# Patient Record
Sex: Female | Born: 1985 | Race: Black or African American | Hispanic: No | Marital: Married | Smoking: Never smoker
Health system: Southern US, Community
[De-identification: ages and names within clinical notes are randomized; demographics above are authoritative.]

## PROBLEM LIST (undated history)

## (undated) ENCOUNTER — Inpatient Hospital Stay (HOSPITAL_COMMUNITY): Payer: Self-pay

## (undated) DIAGNOSIS — B373 Candidiasis of vulva and vagina: Secondary | ICD-10-CM

## (undated) DIAGNOSIS — Z87440 Personal history of urinary (tract) infections: Secondary | ICD-10-CM

## (undated) DIAGNOSIS — IMO0002 Reserved for concepts with insufficient information to code with codable children: Secondary | ICD-10-CM

## (undated) DIAGNOSIS — B977 Papillomavirus as the cause of diseases classified elsewhere: Secondary | ICD-10-CM

## (undated) DIAGNOSIS — D649 Anemia, unspecified: Secondary | ICD-10-CM

## (undated) DIAGNOSIS — B9689 Other specified bacterial agents as the cause of diseases classified elsewhere: Secondary | ICD-10-CM

## (undated) DIAGNOSIS — R002 Palpitations: Secondary | ICD-10-CM

## (undated) DIAGNOSIS — N760 Acute vaginitis: Secondary | ICD-10-CM

## (undated) DIAGNOSIS — Z87898 Personal history of other specified conditions: Secondary | ICD-10-CM

## (undated) DIAGNOSIS — R51 Headache: Secondary | ICD-10-CM

## (undated) HISTORY — DX: Headache: R51

## (undated) HISTORY — DX: Candidiasis of vulva and vagina: B37.3

## (undated) HISTORY — DX: Reserved for concepts with insufficient information to code with codable children: IMO0002

## (undated) HISTORY — DX: Other specified bacterial agents as the cause of diseases classified elsewhere: B96.89

## (undated) HISTORY — DX: Anemia, unspecified: D64.9

## (undated) HISTORY — DX: Papillomavirus as the cause of diseases classified elsewhere: B97.7

## (undated) HISTORY — DX: Acute vaginitis: N76.0

## (undated) HISTORY — DX: Personal history of other specified conditions: Z87.898

## (undated) HISTORY — DX: Personal history of urinary (tract) infections: Z87.440

---
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
## 2005-03-23 ENCOUNTER — Emergency Department (HOSPITAL_COMMUNITY): Admission: EM | Admit: 2005-03-23 | Discharge: 2005-03-24 | Payer: Self-pay | Admitting: Emergency Medicine

## 2006-09-05 ENCOUNTER — Emergency Department (HOSPITAL_COMMUNITY): Admission: EM | Admit: 2006-09-05 | Discharge: 2006-09-05 | Payer: Self-pay | Admitting: Emergency Medicine

## 2008-01-29 ENCOUNTER — Emergency Department (HOSPITAL_COMMUNITY): Admission: EM | Admit: 2008-01-29 | Discharge: 2008-01-29 | Payer: Self-pay | Admitting: Family Medicine

## 2009-04-19 DIAGNOSIS — IMO0002 Reserved for concepts with insufficient information to code with codable children: Secondary | ICD-10-CM

## 2009-04-19 DIAGNOSIS — R87619 Unspecified abnormal cytological findings in specimens from cervix uteri: Secondary | ICD-10-CM

## 2009-04-19 HISTORY — DX: Unspecified abnormal cytological findings in specimens from cervix uteri: R87.619

## 2009-04-19 HISTORY — DX: Reserved for concepts with insufficient information to code with codable children: IMO0002

## 2009-12-20 DIAGNOSIS — Z87898 Personal history of other specified conditions: Secondary | ICD-10-CM

## 2009-12-20 DIAGNOSIS — B9689 Other specified bacterial agents as the cause of diseases classified elsewhere: Secondary | ICD-10-CM

## 2009-12-20 HISTORY — DX: Personal history of other specified conditions: Z87.898

## 2009-12-20 HISTORY — DX: Other specified bacterial agents as the cause of diseases classified elsewhere: B96.89

## 2011-09-10 LAB — I-STAT 8, (EC8 V) (CONVERTED LAB)
BUN: 4 — ABNORMAL LOW
Bicarbonate: 24.9 — ABNORMAL HIGH
Glucose, Bld: 108 — ABNORMAL HIGH
Operator id: 235561
Sodium: 133 — ABNORMAL LOW
TCO2: 26
pCO2, Ven: 40.1 — ABNORMAL LOW

## 2012-02-21 ENCOUNTER — Ambulatory Visit (INDEPENDENT_AMBULATORY_CARE_PROVIDER_SITE_OTHER): Payer: 59 | Admitting: Registered Nurse

## 2012-02-21 DIAGNOSIS — Z3040 Encounter for surveillance of contraceptives, unspecified: Secondary | ICD-10-CM

## 2012-03-15 ENCOUNTER — Encounter (INDEPENDENT_AMBULATORY_CARE_PROVIDER_SITE_OTHER): Payer: 59 | Admitting: Obstetrics and Gynecology

## 2012-03-15 DIAGNOSIS — B3731 Acute candidiasis of vulva and vagina: Secondary | ICD-10-CM

## 2012-03-15 DIAGNOSIS — B373 Candidiasis of vulva and vagina: Secondary | ICD-10-CM

## 2012-03-15 HISTORY — DX: Acute candidiasis of vulva and vagina: B37.31

## 2012-03-15 HISTORY — DX: Candidiasis of vulva and vagina: B37.3

## 2012-07-27 ENCOUNTER — Ambulatory Visit (INDEPENDENT_AMBULATORY_CARE_PROVIDER_SITE_OTHER): Payer: 59 | Admitting: Obstetrics and Gynecology

## 2012-07-27 DIAGNOSIS — Z331 Pregnant state, incidental: Secondary | ICD-10-CM

## 2012-07-27 DIAGNOSIS — Z3201 Encounter for pregnancy test, result positive: Secondary | ICD-10-CM

## 2012-07-27 LAB — POCT URINALYSIS DIPSTICK
Bilirubin, UA: NEGATIVE
Ketones, UA: NEGATIVE
Leukocytes, UA: NEGATIVE
Nitrite, UA: NEGATIVE
pH, UA: 6

## 2012-07-28 LAB — PRENATAL PANEL VII
Antibody Screen: NEGATIVE
Basophils Absolute: 0 10*3/uL (ref 0.0–0.1)
Basophils Relative: 1 % (ref 0–1)
Eosinophils Absolute: 0.1 10*3/uL (ref 0.0–0.7)
Eosinophils Relative: 1 % (ref 0–5)
HCT: 37.5 % (ref 36.0–46.0)
HIV: NONREACTIVE
Hemoglobin: 12.5 g/dL (ref 12.0–15.0)
MCH: 27.7 pg (ref 26.0–34.0)
MCHC: 33.3 g/dL (ref 30.0–36.0)
Monocytes Absolute: 0.6 10*3/uL (ref 0.1–1.0)
Monocytes Relative: 8 % (ref 3–12)
RDW: 14.3 % (ref 11.5–15.5)
Rh Type: POSITIVE

## 2012-07-29 LAB — CULTURE, OB URINE

## 2012-07-31 ENCOUNTER — Encounter: Payer: 59 | Admitting: Obstetrics and Gynecology

## 2012-07-31 LAB — HEMOGLOBINOPATHY EVALUATION
Hgb A2 Quant: 2.8 % (ref 2.2–3.2)
Hgb A: 97.2 % (ref 96.8–97.8)
Hgb F Quant: 0 % (ref 0.0–2.0)

## 2012-08-24 ENCOUNTER — Ambulatory Visit (INDEPENDENT_AMBULATORY_CARE_PROVIDER_SITE_OTHER): Payer: 59

## 2012-08-24 ENCOUNTER — Ambulatory Visit (INDEPENDENT_AMBULATORY_CARE_PROVIDER_SITE_OTHER): Payer: 59 | Admitting: Obstetrics and Gynecology

## 2012-08-24 ENCOUNTER — Encounter: Payer: Self-pay | Admitting: Obstetrics and Gynecology

## 2012-08-24 VITALS — BP 110/70 | Ht 64.0 in | Wt 146.0 lb

## 2012-08-24 DIAGNOSIS — O368390 Maternal care for abnormalities of the fetal heart rate or rhythm, unspecified trimester, not applicable or unspecified: Secondary | ICD-10-CM

## 2012-08-24 DIAGNOSIS — Z124 Encounter for screening for malignant neoplasm of cervix: Secondary | ICD-10-CM

## 2012-08-24 DIAGNOSIS — Z331 Pregnant state, incidental: Secondary | ICD-10-CM

## 2012-08-24 DIAGNOSIS — Z3490 Encounter for supervision of normal pregnancy, unspecified, unspecified trimester: Secondary | ICD-10-CM

## 2012-08-24 DIAGNOSIS — B373 Candidiasis of vulva and vagina: Secondary | ICD-10-CM | POA: Insufficient documentation

## 2012-08-24 LAB — POCT WET PREP (WET MOUNT): pH: 5

## 2012-08-24 LAB — POCT URINALYSIS DIPSTICK
Glucose, UA: NEGATIVE
Leukocytes, UA: NEGATIVE
Protein, UA: NEGATIVE
Urobilinogen, UA: NEGATIVE

## 2012-08-24 LAB — US OB LIMITED

## 2012-08-24 MED ORDER — FLUCONAZOLE 100 MG PO TABS: 100.0000 mg | ORAL_TABLET | Freq: Every day | ORAL | Status: DC

## 2012-08-24 NOTE — Progress Notes (Signed)
Vag itching since yesterday  C/o lower abdominal tightness Pt needs pap today  Pt declines genetic screenings

## 2012-08-24 NOTE — Progress Notes (Signed)
[redacted]w[redacted]d Subjective:    Tasha Collins is being seen today for her first obstetrical visit at [redacted]w[redacted]d gestation by LMP 06/17/12 (Certain of dating)  She reports slight nausea which is transient. Does not desire medication at this time.  Her obstetrical history is significant for: There is no problem list on file for this patient. The patient reports no medical or surgical history. Relationship with FOB:  Involved and supportive and attended NOB visit.  Patient does intend to breast feed.   Pregnancy history fully reviewed.   Review of Systems Pertinent ROS is described in HPI   Objective:   BP 110/70  Ht 5\' 4"  (1.626 m)  Wt 146 lb (66.225 kg)  BMI 25.06 kg/m2  LMP 06/17/2012 Wt Readings from Last 1 Encounters:  08/24/12 146 lb (66.225 kg)   BMI: Body mass index is 25.06 kg/(m^2).  General: alert, cooperative and no distress Respiratory: clear to auscultation bilaterally Cardiovascular: regular rate and rhythm, S1, S2 normal, no murmur Breasts:  No dominant masses, nipples erect Gastrointestinal: soft, non-tender; no masses,  no organomegaly Extremities: extremities normal, no pain or edema Vaginal Bleeding: None  History of Abnormal Paps in the past . Hs had Colpo in the past but have normal Paps in the past few years. Pap today. Last Pap on record 01/2011.  EXTERNAL GENITALIA: normal appearing vulva with no masses, tenderness or lesions VAGINA: no abnormal discharge or lesions CERVIX: no lesions or cervical motion tenderness; cervix closed, long, firm UTERUS: gravid and consistent with 9 weeks ADNEXA: no masses palpable and nontender OB EXAM PELVIMETRY: appears adequate   FHR:  162 bpm by USS.  Assessment:    Pregnancy at  [redacted]w[redacted]d  Plan:     Prenatal panel reviewed and discussed with the patient:yes Pap smear collected:yes GC/Chlamydia collected:yes Wet prep:  PH 4.0 Yeast infection noted. Discussion of Genetic testing options: discussed and patient declined any  screening. Prenatal vitamins recommended - taking same Problem list reviewed and updated.  Plan of care: Follow up in 4 weeks for ROB  Malala Trenkamp CNM, [place] 08/24/2012 1:16 PM

## 2012-08-26 LAB — CULTURE, OB URINE

## 2012-08-28 LAB — PAP IG, CT-NG, RFX HPV ASCU

## 2012-09-21 ENCOUNTER — Encounter: Payer: Self-pay | Admitting: Obstetrics and Gynecology

## 2012-09-21 ENCOUNTER — Ambulatory Visit (INDEPENDENT_AMBULATORY_CARE_PROVIDER_SITE_OTHER): Payer: 59 | Admitting: Obstetrics and Gynecology

## 2012-09-21 VITALS — BP 108/60 | Wt 158.0 lb

## 2012-09-21 DIAGNOSIS — T360X5A Adverse effect of penicillins, initial encounter: Secondary | ICD-10-CM

## 2012-09-21 DIAGNOSIS — Z880 Allergy status to penicillin: Secondary | ICD-10-CM | POA: Insufficient documentation

## 2012-09-21 DIAGNOSIS — Z331 Pregnant state, incidental: Secondary | ICD-10-CM

## 2012-09-21 DIAGNOSIS — Z888 Allergy status to other drugs, medicaments and biological substances status: Secondary | ICD-10-CM

## 2012-09-21 NOTE — Patient Instructions (Signed)
ABCs of Pregnancy  A  Antepartum care is very important. Be sure you see your doctor and get prenatal care as soon as you think you are pregnant. At this time, you will be tested for infection, genetic abnormalities and potential problems with you and the pregnancy. This is the time to discuss diet, exercise, work, medications, labor, pain medication during labor and the possibility of a cesarean delivery. Ask any questions that may concern you. It is important to see your doctor regularly throughout your pregnancy. Avoid exposure to toxic substances and chemicals - such as cleaning solvents, lead and mercury, some insecticides, and paint. Pregnant women should avoid exposure to paint fumes, and fumes that cause you to feel ill, dizzy or faint. When possible, it is a good idea to have a pre-pregnancy consultation with your caregiver to begin some important recommendations your caregiver suggests such as, taking folic acid, exercising, quitting smoking, avoiding alcoholic beverages, etc.  B  Breastfeeding is the healthiest choice for both you and your baby. It has many nutritional benefits for the baby and health benefits for the mother. It also creates a very tight and loving bond between the baby and mother. Talk to your doctor, your family and friends, and your employer about how you choose to feed your baby and how they can support you in your decision. Not all birth defects can be prevented, but a woman can take actions that may increase her chance of having a healthy baby. Many birth defects happen very early in pregnancy, sometimes before a woman even knows she is pregnant. Birth defects or abnormalities of any child in your or the father's family should be discussed with your caregiver. Get a good support bra as your breast size changes. Wear it especially when you exercise and when nursing.   C  Celebrate the news of your pregnancy with the your spouse/father and family. Childbirth classes are helpful to  take for you and the spouse/father because it helps to understand what happens during the pregnancy, labor and delivery. Cesarean delivery should be discussed with your doctor so you are prepared for that possibility. The pros and cons of circumcision if it is a boy, should be discussed with your pediatrician. Cigarette smoking during pregnancy can result in low birth weight babies. It has been associated with infertility, miscarriages, tubal pregnancies, infant death (mortality) and poor health (morbidity) in childhood. Additionally, cigarette smoking may cause long-term learning disabilities. If you smoke, you should try to quit before getting pregnant and not smoke during the pregnancy. Secondary smoke may also harm a mother and her developing baby. It is a good idea to ask people to stop smoking around you during your pregnancy and after the baby is born. Extra calcium is necessary when you are pregnant and is found in your prenatal vitamin, in dairy products, green leafy vegetables and in calcium supplements.  D  A healthy diet according to your current weight and height, along with vitamins and mineral supplements should be discussed with your caregiver. Domestic abuse or violence should be made known to your doctor right away to get the situation corrected. Drink more water when you exercise to keep hydrated. Discomfort of your back and legs usually develops and progresses from the middle of the second trimester through to delivery of the baby. This is because of the enlarging baby and uterus, which may also affect your balance. Do not take illegal drugs. Illegal drugs can seriously harm the baby and you. Drink extra   fluids (water is best) throughout pregnancy to help your body keep up with the increases in your blood volume. Drink at least 6 to 8 glasses of water, fruit juice, or milk each day. A good way to know you are drinking enough fluid is when your urine looks almost like clear water or is very light  yellow.   E  Eat healthy to get the nutrients you and your unborn baby need. Your meals should include the five basic food groups. Exercise (30 minutes of light to moderate exercise a day) is important and encouraged during pregnancy, if there are no medical problems or problems with the pregnancy. Exercise that causes discomfort or dizziness should be stopped and reported to your caregiver. Emotions during pregnancy can change from being ecstatic to depression and should be understood by you, your partner and your family.  F  Fetal screening with ultrasound, amniocentesis and monitoring during pregnancy and labor is common and sometimes necessary. Take 400 micrograms of folic acid daily both before, when possible, and during the first few months of pregnancy to reduce the risk of birth defects of the brain and spine. All women who could possibly become pregnant should take a vitamin with folic acid, every day. It is also important to eat a healthy diet with fortified foods (enriched grain products, including cereals, rice, breads, and pastas) and foods with natural sources of folate (orange juice, green leafy vegetables, beans, peanuts, broccoli, asparagus, peas, and lentils). The father should be involved with all aspects of the pregnancy including, the prenatal care, childbirth classes, labor, delivery, and postpartum time. Fathers may also have emotional concerns about being a father, financial needs, and raising a family.  G  Genetic testing should be done appropriately. It is important to know your family and the father's history. If there have been problems with pregnancies or birth defects in your family, report these to your doctor. Also, genetic counselors can talk with you about the information you might need in making decisions about having a family. You can call a major medical center in your area for help in finding a board-certified genetic counselor. Genetic testing and counseling should be done  before pregnancy when possible, especially if there is a history of problems in the mother's or father's family. Certain ethnic backgrounds are more at risk for genetic defects.  H  Get familiar with the hospital where you will be having your baby. Get to know how long it takes to get there, the labor and delivery area, and the hospital procedures. Be sure your medical insurance is accepted there. Get your home ready for the baby including, clothes, the baby's room (when possible), furniture and car seat. Hand washing is important throughout the day, especially after handling raw meat and poultry, changing the baby's diaper or using the bathroom. This can help prevent the spread of many bacteria and viruses that cause infection. Your hair may become dry and thinner, but will return to normal a few weeks after the baby is born. Heartburn is a common problem that can be treated by taking antacids recommended by your caregiver, eating smaller meals 5 or 6 times a day, not drinking liquids when eating, drinking between meals and raising the head of your bed 2 to 3 inches.  I  Insurance to cover you, the baby, doctor and hospital should be reviewed so that you will be prepared to pay any costs not covered by your insurance plan. If you do not have medical insurance,   there are usually clinics and services available for you in your community. Take 30 milligrams of iron during your pregnancy as prescribed by your doctor to reduce the risk of low red blood cells (anemia) later in pregnancy. All women of childbearing age should eat a diet rich in iron.  J  There should be a joint effort for the mother, father and any other children to adapt to the pregnancy financially, emotionally, and psychologically during the pregnancy. Join a support group for moms-to-be. Or, join a class on parenting or childbirth. Have the family participate when possible.  K  Know your limits. Let your caregiver know if you experience any of the  following:   · Pain of any kind.  · Strong cramps.  · You develop a lot of weight in a short period of time (5 pounds in 3 to 5 days).  · Vaginal bleeding, leaking of amniotic fluid.  · Headache, vision problems.  · Dizziness, fainting, shortness of breath.  · Chest pain.  · Fever of 102° F (38.9° C) or higher.  · Gush of clear fluid from your vagina.  · Painful urination.  · Domestic violence.  · Irregular heartbeat (palpitations).  · Rapid beating of the heart (tachycardia).  · Constant feeling sick to your stomach (nauseous) and vomiting.  · Trouble walking, fluid retention (edema).  · Muscle weakness.  · If your baby has decreased activity.  · Persistent diarrhea.  · Abnormal vaginal discharge.  · Uterine contractions at 20-minute intervals.  · Back pain that travels down your leg.  L  Learn and practice that what you eat and drink should be in moderation and healthy for you and your baby. Legal drugs such as alcohol and caffeine are important issues for pregnant women. There is no safe amount of alcohol a woman can drink while pregnant. Fetal alcohol syndrome, a disorder characterized by growth retardation, facial abnormalities, and central nervous system dysfunction, is caused by a woman's use of alcohol during pregnancy. Caffeine, found in tea, coffee, soft drinks and chocolate, should also be limited. Be sure to read labels when trying to cut down on caffeine during pregnancy. More than 200 foods, beverages, and over-the-counter medications contain caffeine and have a high salt content! There are coffees and teas that do not contain caffeine.  M  Medical conditions such as diabetes, epilepsy, and high blood pressure should be treated and kept under control before pregnancy when possible, but especially during pregnancy. Ask your caregiver about any medications that may need to be changed or adjusted during pregnancy. If you are currently taking any medications, ask your caregiver if it is safe to take them  while you are pregnant or before getting pregnant when possible. Also, be sure to discuss any herbs or vitamins you are taking. They are medicines, too! Discuss with your doctor all medications, prescribed and over-the-counter, that you are taking. During your prenatal visit, discuss the medications your doctor may give you during labor and delivery.  N  Never be afraid to ask your doctor or caregiver questions about your health, the progress of the pregnancy, family problems, stressful situations, and recommendation for a pediatrician, if you do not have one. It is better to take all precautions and discuss any questions or concerns you may have during your office visits. It is a good idea to write down your questions before you visit the doctor.  O  Over-the-counter cough and cold remedies may contain alcohol or other ingredients that should   be avoided during pregnancy. Ask your caregiver about prescription, herbs or over-the-counter medications that you are taking or may consider taking while pregnant.   P  Physical activity during pregnancy can benefit both you and your baby by lessening discomfort and fatigue, providing a sense of well-being, and increasing the likelihood of early recovery after delivery. Light to moderate exercise during pregnancy strengthens the belly (abdominal) and back muscles. This helps improve posture. Practicing yoga, walking, swimming, and cycling on a stationary bicycle are usually safe exercises for pregnant women. Avoid scuba diving, exercise at high altitudes (over 3000 feet), skiing, horseback riding, contact sports, etc. Always check with your doctor before beginning any kind of exercise, especially during pregnancy and especially if you did not exercise before getting pregnant.  Q  Queasiness, stomach upset and morning sickness are common during pregnancy. Eating a couple of crackers or dry toast before getting out of bed. Foods that you normally love may make you feel sick to  your stomach. You may need to substitute other nutritious foods. Eating 5 or 6 small meals a day instead of 3 large ones may make you feel better. Do not drink with your meals, drink between meals. Questions that you have should be written down and asked during your prenatal visits.  R  Read about and make plans to baby-proof your home. There are important tips for making your home a safer environment for your baby. Review the tips and make your home safer for you and your baby. Read food labels regarding calories, salt and fat content in the food.  S  Saunas, hot tubs, and steam rooms should be avoided while you are pregnant. Excessive high heat may be harmful during your pregnancy. Your caregiver will screen and examine you for sexually transmitted diseases and genetic disorders during your prenatal visits. Learn the signs of labor. Sexual relations while pregnant is safe unless there is a medical or pregnancy problem and your caregiver advises against it.  T  Traveling long distances should be avoided especially in the third trimester of your pregnancy. If you do have to travel out of state, be sure to take a copy of your medical records and medical insurance plan with you. You should not travel long distances without seeing your doctor first. Most airlines will not allow you to travel after 36 weeks of pregnancy. Toxoplasmosis is an infection caused by a parasite that can seriously harm an unborn baby. Avoid eating undercooked meat and handling cat litter. Be sure to wear gloves when gardening. Tingling of the hands and fingers is not unusual and is due to fluid retention. This will go away after the baby is born.  U  Womb (uterus) size increases during the first trimester. Your kidneys will begin to function more efficiently. This may cause you to feel the need to urinate more often. You may also leak urine when sneezing, coughing or laughing. This is due to the growing uterus pressing against your bladder,  which lies directly in front of and slightly under the uterus during the first few months of pregnancy. If you experience burning along with frequency of urination or bloody urine, be sure to tell your doctor. The size of your uterus in the third trimester may cause a problem with your balance. It is advisable to maintain good posture and avoid wearing high heels during this time. An ultrasound of your baby may be necessary during your pregnancy and is safe for you and your baby.  V    Vaccinations are an important concern for pregnant women. Get needed vaccines before pregnancy. Center for Disease Control (www.cdc.gov) has clear guidelines for the use of vaccines during pregnancy. Review the list, be sure to discuss it with your doctor. Prenatal vitamins are helpful and healthy for you and the baby. Do not take extra vitamins except what is recommended. Taking too much of certain vitamins can cause overdose problems. Continuous vomiting should be reported to your caregiver. Varicose veins may appear especially if there is a family history of varicose veins. They should subside after the delivery of the baby. Support hose helps if there is leg discomfort.  W  Being overweight or underweight during pregnancy may cause problems. Try to get within 15 pounds of your ideal weight before pregnancy. Remember, pregnancy is not a time to be dieting! Do not stop eating or start skipping meals as your weight increases. Both you and your baby need the calories and nutrition you receive from a healthy diet. Be sure to consult with your doctor about your diet. There is a formula and diet plan available depending on whether you are overweight or underweight. Your caregiver or nutritionist can help and advise you if necessary.  X  Avoid X-rays. If you must have dental work or diagnostic tests, tell your dentist or physician that you are pregnant so that extra care can be taken. X-rays should only be taken when the risks of not taking  them outweigh the risk of taking them. If needed, only the minimum amount of radiation should be used. When X-rays are necessary, protective lead shields should be used to cover areas of the body that are not being X-rayed.  Y  Your baby loves you. Breastfeeding your baby creates a loving and very close bond between the two of you. Give your baby a healthy environment to live in while you are pregnant. Infants and children require constant care and guidance. Their health and safety should be carefully watched at all times. After the baby is born, rest or take a nap when the baby is sleeping.  Z  Get your ZZZs. Be sure to get plenty of rest. Resting on your side as often as possible, especially on your left side is advised. It provides the best circulation to your baby and helps reduce swelling. Try taking a nap for 30 to 45 minutes in the afternoon when possible. After the baby is born rest or take a nap when the baby is sleeping. Try elevating your feet for that amount of time when possible. It helps the circulation in your legs and helps reduce swelling.   Most information courtesy of the CDC.  Document Released: 12/06/2005 Document Revised: 02/28/2012 Document Reviewed: 08/20/2009  [employer]® Patient Information ©2013 [employer].

## 2012-09-21 NOTE — Progress Notes (Signed)
Pt without c/o She declined genetic testing

## 2012-09-26 ENCOUNTER — Telehealth: Payer: Self-pay | Admitting: Obstetrics and Gynecology

## 2012-09-26 NOTE — Telephone Encounter (Signed)
Spoke with pt rgd concerns pt c/o some cramping yesterday for about 30 mins and occ today wants to know if normal no spotting no bleeding no leaking fluids drinking 8 glasses of water advised pt some cramping i normal because uterus is growing increase water intake can take tylenol for discomfort call office if spotting bleeding occur pt voice understanding

## 2012-10-09 ENCOUNTER — Telehealth: Payer: Self-pay | Admitting: Obstetrics and Gynecology

## 2012-10-09 NOTE — Telephone Encounter (Signed)
Tc to pt per headaches x 3 days. Pt c/o some visual changes and dizziness. Pt drinking approx 9 8oz glasses of water daily and has tried Tylenol XS without improvement. Pt with h/o migraines in middle school. Informed pt to try Ibuprofen OTC 600mg q 6hours x 24 hours. Told to cb with report of how headaches are going. Pt to cb before 24 hours if sx's worsen or headaches persist. Pt voices understanding.  

## 2012-10-10 ENCOUNTER — Ambulatory Visit (INDEPENDENT_AMBULATORY_CARE_PROVIDER_SITE_OTHER): Payer: 59 | Admitting: Obstetrics and Gynecology

## 2012-10-10 ENCOUNTER — Telehealth: Payer: Self-pay

## 2012-10-10 VITALS — BP 124/64 | Wt 157.0 lb

## 2012-10-10 DIAGNOSIS — Z3490 Encounter for supervision of normal pregnancy, unspecified, unspecified trimester: Secondary | ICD-10-CM

## 2012-10-10 DIAGNOSIS — Z331 Pregnant state, incidental: Secondary | ICD-10-CM

## 2012-10-10 DIAGNOSIS — Z139 Encounter for screening, unspecified: Secondary | ICD-10-CM

## 2012-10-10 DIAGNOSIS — Z3689 Encounter for other specified antenatal screening: Secondary | ICD-10-CM

## 2012-10-10 LAB — CBC
HCT: 38.8 % (ref 36.0–46.0)
Hemoglobin: 13.2 g/dL (ref 12.0–15.0)
MCV: 82.6 fL (ref 78.0–100.0)
Platelets: 294 10*3/uL (ref 150–400)
RBC: 4.7 MIL/uL (ref 3.87–5.11)
WBC: 6.9 10*3/uL (ref 4.0–10.5)

## 2012-10-10 MED ORDER — METOCLOPRAMIDE HCL 10 MG PO TABS: 10.0000 mg | ORAL_TABLET | Freq: Four times a day (QID) | ORAL | Status: DC | PRN

## 2012-10-10 NOTE — Progress Notes (Signed)
Pulse 84 today. Tasha Collins

## 2012-10-10 NOTE — Progress Notes (Signed)
[redacted]w[redacted]d C/o lt headedness and dizziness Anatomy scan RTO 3wks Check TSH and CBC and Vit D Likely having round ligament pain recs discussed Pulse 84 Trial of reglan as well pt gets nausea with occas HA

## 2012-10-10 NOTE — Telephone Encounter (Signed)
Tc from pt. Pt states,"headache is gone;however feeling light headed". Pt also c/o SOB today. Appt sched today @ 3:30 with [facility] for eval. Pt agrees.

## 2012-10-10 NOTE — Addendum Note (Signed)
Addended by: Marla Roe A on: 10/10/2012 05:25 PM   Modules accepted: Orders

## 2012-10-19 ENCOUNTER — Encounter: Payer: 59 | Admitting: Obstetrics and Gynecology

## 2012-10-31 ENCOUNTER — Ambulatory Visit (INDEPENDENT_AMBULATORY_CARE_PROVIDER_SITE_OTHER): Payer: 59

## 2012-10-31 ENCOUNTER — Ambulatory Visit (INDEPENDENT_AMBULATORY_CARE_PROVIDER_SITE_OTHER): Payer: 59 | Admitting: Obstetrics and Gynecology

## 2012-10-31 ENCOUNTER — Encounter: Payer: Self-pay | Admitting: Obstetrics and Gynecology

## 2012-10-31 VITALS — BP 108/68 | Wt 162.0 lb

## 2012-10-31 DIAGNOSIS — Z3689 Encounter for other specified antenatal screening: Secondary | ICD-10-CM

## 2012-10-31 DIAGNOSIS — Z331 Pregnant state, incidental: Secondary | ICD-10-CM

## 2012-10-31 NOTE — Progress Notes (Signed)
Pt w/o complaint today, will decide if she wants the flu shot.

## 2012-10-31 NOTE — Progress Notes (Signed)
[place] for anatomy  S=D.   cx 3.98 cm Normal adnexa B Breech pres, posterior placenta with 3 vc Normal anatomy Bilateral pyelectasis.  Need f/u US in 4 weeks Pt without c/o Declined genetic screening.  Will get flu vaccine on [place]

## 2012-11-02 LAB — US OB COMP + 14 WK

## 2012-11-28 ENCOUNTER — Ambulatory Visit (INDEPENDENT_AMBULATORY_CARE_PROVIDER_SITE_OTHER): Payer: 59 | Admitting: Obstetrics and Gynecology

## 2012-11-28 ENCOUNTER — Ambulatory Visit (INDEPENDENT_AMBULATORY_CARE_PROVIDER_SITE_OTHER): Payer: 59

## 2012-11-28 ENCOUNTER — Encounter: Payer: Self-pay | Admitting: Obstetrics and Gynecology

## 2012-11-28 VITALS — BP 114/64 | Wt 163.0 lb

## 2012-11-28 DIAGNOSIS — O289 Unspecified abnormal findings on antenatal screening of mother: Secondary | ICD-10-CM

## 2012-11-28 DIAGNOSIS — Z23 Encounter for immunization: Secondary | ICD-10-CM

## 2012-11-28 DIAGNOSIS — O358XX0 Maternal care for other (suspected) fetal abnormality and damage, not applicable or unspecified: Secondary | ICD-10-CM

## 2012-11-28 DIAGNOSIS — Z331 Pregnant state, incidental: Secondary | ICD-10-CM

## 2012-11-28 DIAGNOSIS — O35EXX0 Maternal care for other (suspected) fetal abnormality and damage, fetal genitourinary anomalies, not applicable or unspecified: Secondary | ICD-10-CM | POA: Insufficient documentation

## 2012-11-28 DIAGNOSIS — Z880 Allergy status to penicillin: Secondary | ICD-10-CM

## 2012-11-28 NOTE — Patient Instructions (Signed)
Hypoglycemia information

## 2012-11-28 NOTE — Progress Notes (Signed)
[redacted]w[redacted]d Pt c/o having dizziness daily x 1 week. Hgb nl.  Probable hypoglycemia.  Dietary hints given Pt received flu vaccine  ULTRASOUND: Left Ovary:    Length: 4.50 CM  Width: 2.04 CM  Height: 2.72 CM Right Ovary:  Length: 3.85 CM  Width: 2.45 CM  Height: 2.83 CM  Left ovary:Normal Right ovary:NormaL           SIUP  S=D     [place] EDD: 03/25/2013           AFI: N/A           Cervical length: 3.22 cm           Placenta localization: posterior           Fetal presentation: VERTEX Comments: Vertical pocket = 5.3 CM. Bilateral Pyelectasis seen ( right kidney = 0.26 cm, Left Kidney = 0.53 cm ) suggest f/u in 4-6 weeks. Normal ovaries/adnexas Repeat u/s in 6 wks

## 2012-12-06 ENCOUNTER — Telehealth: Payer: Self-pay | Admitting: Obstetrics and Gynecology

## 2012-12-07 NOTE — Telephone Encounter (Signed)
TC TO PT REGARDING MESSAGE. PT STATES SHE IS HAVING SOME PAIN THAT MIGRATES FROM STOMACH TO HER BACK. PT STATES SHE SHE IS DRINKING 10 GLASSES OF WATER A DAY.  PT STATES SHE DOES FEEL THE BABY MOVING AND SHE IS NOT HAVING ANY SPOTTING. ADVISED PT TO CONTINUE TO PUSH FLUIDS AND INFORMED PT THAT SHE CAN TAKE TYLENOL OR IBUPROFEN(WHICH EVER SHE CAN TOLERATE AND SEE IF THA T WILL GIVE PT RELIEF. IF PT START TO HAVE MORE PAIN OR IF PT START TO SPOT OR CRAMP; TO GIVE [place] A CALL BACK. PT VOICED UNDERSTANDING.

## 2012-12-15 LAB — US OB FOLLOW UP

## 2012-12-20 NOTE — L&D Delivery Note (Signed)
Delivery Note  After laboring down about 2 hours, pt pushed approx 15 minutes  At 10:48 PM a viable female was delivered via Vaginal, Spontaneous Delivery (Presentation: ; Occiput Anterior). Delivered through loose nuchal cord APGAR: 8, 9; weight: pending    Placenta status: Intact, Spontaneous.  Cord: 3 vessels with the following complications: None.  Cord pH: n/a  Anesthesia: Epidural, local  Episiotomy: None Lacerations: Perineal; partial 3rd degree Suture Repair: 3.0 vicryl rapide Est. Blood Loss (mL): 300  Mom to postpartum.  Baby to nursery-stable. Pt plans to BF Infant remains skin-skin Mom and baby stable in recovery room Routine PP orders   Andrae Claunch M 03/23/2013, 12:11 AM

## 2012-12-26 ENCOUNTER — Other Ambulatory Visit: Payer: 59

## 2012-12-26 ENCOUNTER — Encounter: Payer: Self-pay | Admitting: Obstetrics and Gynecology

## 2012-12-26 ENCOUNTER — Ambulatory Visit (INDEPENDENT_AMBULATORY_CARE_PROVIDER_SITE_OTHER): Payer: 59 | Admitting: Obstetrics and Gynecology

## 2012-12-26 VITALS — BP 104/70 | Wt 168.0 lb

## 2012-12-26 DIAGNOSIS — N1330 Unspecified hydronephrosis: Secondary | ICD-10-CM

## 2012-12-26 DIAGNOSIS — Z3480 Encounter for supervision of other normal pregnancy, unspecified trimester: Secondary | ICD-10-CM

## 2012-12-26 DIAGNOSIS — N2889 Other specified disorders of kidney and ureter: Secondary | ICD-10-CM

## 2012-12-26 LAB — CBC
HCT: 34.4 % — ABNORMAL LOW (ref 36.0–46.0)
RDW: 14.4 % (ref 11.5–15.5)
WBC: 6.8 10*3/uL (ref 4.0–10.5)

## 2012-12-26 NOTE — Progress Notes (Signed)
[redacted]w[redacted]d H/o pyelectasis - u/s to f/u at [place] H/o rt low back pain 2wks ago that lasted about an hour but resolved itself with no recurrence No urinary sxs FKCs

## 2012-12-27 LAB — GLUCOSE TOLERANCE, 1 HOUR (50G) W/O FASTING: Glucose, 1 Hour GTT: 143 mg/dL — ABNORMAL HIGH (ref 70–140)

## 2012-12-29 ENCOUNTER — Telehealth: Payer: Self-pay

## 2012-12-29 DIAGNOSIS — N1330 Unspecified hydronephrosis: Secondary | ICD-10-CM

## 2012-12-29 DIAGNOSIS — O99810 Abnormal glucose complicating pregnancy: Secondary | ICD-10-CM

## 2012-12-29 NOTE — Telephone Encounter (Signed)
Pt was called and advised need of 3 hr gtt. Pt has appt on 01/02/2013 at [facility] lab [address]@ 8:30 am. Pt requested diet and all other information to be faxed to her @ [phone]. Tasha Collins

## 2013-01-03 LAB — GLUCOSE TOLERANCE, 3 HOURS
Glucose Tolerance, 2 hour: 151 mg/dL (ref 70–164)
Glucose, GTT - 3 Hour: 137 mg/dL (ref 70–144)

## 2013-01-11 ENCOUNTER — Encounter: Payer: Self-pay | Admitting: Obstetrics and Gynecology

## 2013-01-11 ENCOUNTER — Ambulatory Visit: Payer: 59

## 2013-01-11 ENCOUNTER — Ambulatory Visit: Payer: 59 | Admitting: Obstetrics and Gynecology

## 2013-01-11 VITALS — BP 110/70 | Wt 171.0 lb

## 2013-01-11 DIAGNOSIS — O358XX0 Maternal care for other (suspected) fetal abnormality and damage, not applicable or unspecified: Secondary | ICD-10-CM

## 2013-01-11 DIAGNOSIS — N898 Other specified noninflammatory disorders of vagina: Secondary | ICD-10-CM

## 2013-01-11 LAB — POCT WET PREP (WET MOUNT)
Clue Cells Wet Prep Whiff POC: NEGATIVE
WBC, Wet Prep HPF POC: NEGATIVE

## 2013-01-11 LAB — US OB FOLLOW UP

## 2013-01-11 MED ORDER — TERCONAZOLE 0.4 % VA CREA: 1.0000 | TOPICAL_CREAM | Freq: Every day | VAGINAL | Status: AC

## 2013-01-11 NOTE — Progress Notes (Signed)
[redacted]w[redacted]d Pt c/o vaginal itching. 3 gtt completed WNL 01/02/13  Growth and Pyelectasis Ultrasound shows:  SIUP  S=D     [place] EDD: 03/24/13           AFI: 13.3 cm = 40th%tile normal fluid           EFW = 3 lbs 3oz AUA 53% th tile Expected 53%tile            Cervical length: 3.52 cm           Placenta localization: posterior           Fetal presentation: vertex

## 2013-01-11 NOTE — Progress Notes (Signed)
[redacted]w[redacted]d C/o vaginal itching and increased d/c Wet prep + yeast Rx Terazol 7 x 7 days then once/week for 7 weeks - due to recurrent yeast [place] toady - Pyelectasis resolved.  3 hr glucola - WNL Reveiwed PTL precautions, labor s/s

## 2013-01-25 ENCOUNTER — Ambulatory Visit: Payer: 59 | Admitting: Obstetrics and Gynecology

## 2013-01-25 ENCOUNTER — Encounter: Payer: Self-pay | Admitting: Obstetrics and Gynecology

## 2013-01-25 VITALS — BP 114/64 | Wt 173.0 lb

## 2013-01-25 DIAGNOSIS — Z331 Pregnant state, incidental: Secondary | ICD-10-CM

## 2013-01-25 NOTE — Patient Instructions (Signed)
Fetal Movement Counts Patient Name: __________________________________________________ Patient Due Date: ____________________ Kick counts is highly recommended in high risk pregnancies, but it is a good idea for every pregnant woman to do. Start counting fetal movements at 28 weeks of the pregnancy. Fetal movements increase after eating a full meal or eating or drinking something sweet (the blood sugar is higher). It is also important to drink plenty of fluids (well hydrated) before doing the count. Lie on your left side because it helps with the circulation or you can sit in a comfortable chair with your arms over your belly (abdomen) with no distractions around you. DOING THE COUNT  Try to do the count the same time of day each time you do it.  Mark the day and time, then see how long it takes for you to feel 10 movements (kicks, flutters, swishes, rolls). You should have at least 10 movements within 2 hours. You will most likely feel 10 movements in much less than 2 hours. If you do not, wait an hour and count again. After a couple of days you will see a pattern.  What you are looking for is a change in the pattern or not enough counts in 2 hours. Is it taking longer in time to reach 10 movements? SEEK MEDICAL CARE IF:  You feel less than 10 counts in 2 hours. Tried twice.  No movement in one hour.  The pattern is changing or taking longer each day to reach 10 counts in 2 hours.  You feel the baby is not moving as it usually does. Date: ____________ Movements: ____________ Start time: ____________ Finish time: ____________  Date: ____________ Movements: ____________ Start time: ____________ Finish time: ____________ Date: ____________ Movements: ____________ Start time: ____________ Finish time: ____________ Date: ____________ Movements: ____________ Start time: ____________ Finish time: ____________ Date: ____________ Movements: ____________ Start time: ____________ Finish time:  ____________ Date: ____________ Movements: ____________ Start time: ____________ Finish time: ____________ Date: ____________ Movements: ____________ Start time: ____________ Finish time: ____________ Date: ____________ Movements: ____________ Start time: ____________ Finish time: ____________  Date: ____________ Movements: ____________ Start time: ____________ Finish time: ____________ Date: ____________ Movements: ____________ Start time: ____________ Finish time: ____________ Date: ____________ Movements: ____________ Start time: ____________ Finish time: ____________ Date: ____________ Movements: ____________ Start time: ____________ Finish time: ____________ Date: ____________ Movements: ____________ Start time: ____________ Finish time: ____________ Date: ____________ Movements: ____________ Start time: ____________ Finish time: ____________ Date: ____________ Movements: ____________ Start time: ____________ Finish time: ____________  Date: ____________ Movements: ____________ Start time: ____________ Finish time: ____________ Date: ____________ Movements: ____________ Start time: ____________ Finish time: ____________ Date: ____________ Movements: ____________ Start time: ____________ Finish time: ____________ Date: ____________ Movements: ____________ Start time: ____________ Finish time: ____________ Date: ____________ Movements: ____________ Start time: ____________ Finish time: ____________ Date: ____________ Movements: ____________ Start time: ____________ Finish time: ____________ Date: ____________ Movements: ____________ Start time: ____________ Finish time: ____________  Date: ____________ Movements: ____________ Start time: ____________ Finish time: ____________ Date: ____________ Movements: ____________ Start time: ____________ Finish time: ____________ Date: ____________ Movements: ____________ Start time: ____________ Finish time: ____________ Date: ____________ Movements:  ____________ Start time: ____________ Finish time: ____________ Date: ____________ Movements: ____________ Start time: ____________ Finish time: ____________ Date: ____________ Movements: ____________ Start time: ____________ Finish time: ____________ Date: ____________ Movements: ____________ Start time: ____________ Finish time: ____________  Date: ____________ Movements: ____________ Start time: ____________ Finish time: ____________ Date: ____________ Movements: ____________ Start time: ____________ Finish time: ____________ Date: ____________ Movements: ____________ Start time: ____________ Finish time: ____________ Date: ____________ Movements:   ____________ Start time: ____________ Finish time: ____________ Date: ____________ Movements: ____________ Start time: ____________ Finish time: ____________ Date: ____________ Movements: ____________ Start time: ____________ Finish time: ____________ Date: ____________ Movements: ____________ Start time: ____________ Finish time: ____________  Date: ____________ Movements: ____________ Start time: ____________ Finish time: ____________ Date: ____________ Movements: ____________ Start time: ____________ Finish time: ____________ Date: ____________ Movements: ____________ Start time: ____________ Finish time: ____________ Date: ____________ Movements: ____________ Start time: ____________ Finish time: ____________ Date: ____________ Movements: ____________ Start time: ____________ Finish time: ____________ Date: ____________ Movements: ____________ Start time: ____________ Finish time: ____________ Date: ____________ Movements: ____________ Start time: ____________ Finish time: ____________  Date: ____________ Movements: ____________ Start time: ____________ Finish time: ____________ Date: ____________ Movements: ____________ Start time: ____________ Finish time: ____________ Date: ____________ Movements: ____________ Start time: ____________ Finish  time: ____________ Date: ____________ Movements: ____________ Start time: ____________ Finish time: ____________ Date: ____________ Movements: ____________ Start time: ____________ Finish time: ____________ Date: ____________ Movements: ____________ Start time: ____________ Finish time: ____________ Date: ____________ Movements: ____________ Start time: ____________ Finish time: ____________  Date: ____________ Movements: ____________ Start time: ____________ Finish time: ____________ Date: ____________ Movements: ____________ Start time: ____________ Finish time: ____________ Date: ____________ Movements: ____________ Start time: ____________ Finish time: ____________ Date: ____________ Movements: ____________ Start time: ____________ Finish time: ____________ Date: ____________ Movements: ____________ Start time: ____________ Finish time: ____________ Date: ____________ Movements: ____________ Start time: ____________ Finish time: ____________ Document Released: 01/05/2007 Document Revised: 02/28/2012 Document Reviewed: 07/08/2009 [employer] Patient Information 2013 [employer].  

## 2013-01-25 NOTE — Progress Notes (Signed)
Pt c/o more swelling in hands and feet.

## 2013-01-25 NOTE — Progress Notes (Signed)
[redacted]w[redacted]d [facility] addressed

## 2013-02-08 ENCOUNTER — Ambulatory Visit: Payer: 59 | Admitting: Certified Nurse Midwife

## 2013-02-08 VITALS — BP 102/62 | Wt 176.0 lb

## 2013-02-08 DIAGNOSIS — Z331 Pregnant state, incidental: Secondary | ICD-10-CM

## 2013-02-08 NOTE — Progress Notes (Signed)
C/o of swelling in hands and back pain . Pt no other issues today.

## 2013-02-08 NOTE — Progress Notes (Signed)
[redacted]w[redacted]d GFM Pt reports swelling in hands and feet that occurs during the night and dissipates within an hour of walking around and getting ready for work in the morning, and does not occur during the day.  Encouraged hydration and and make sure she is not consuming high salt throughout the day.  Reassurance given Labor precautions rv'd GBS with sensitivity @ [place]

## 2013-02-22 ENCOUNTER — Ambulatory Visit: Payer: 59 | Admitting: Family Medicine

## 2013-02-22 VITALS — BP 104/62 | Wt 180.0 lb

## 2013-02-22 DIAGNOSIS — N898 Other specified noninflammatory disorders of vagina: Secondary | ICD-10-CM

## 2013-02-22 DIAGNOSIS — Z3490 Encounter for supervision of normal pregnancy, unspecified, unspecified trimester: Secondary | ICD-10-CM

## 2013-02-22 DIAGNOSIS — O368121 Decreased fetal movements, second trimester, fetus 1: Secondary | ICD-10-CM

## 2013-02-22 NOTE — Progress Notes (Signed)
[redacted]w[redacted]d Pt complains of increased vaginal discharge. Decrease fetal movement x 1wk. Appetite not as good, having family crisis. GBS today.

## 2013-02-22 NOTE — Patient Instructions (Signed)
Fetal Movement Counts Patient Name: __________________________________________________ Patient Due Date: ____________________ Kick counts is highly recommended in high risk pregnancies, but it is a good idea for every pregnant woman to do. Start counting fetal movements at 28 weeks of the pregnancy. Fetal movements increase after eating a full meal or eating or drinking something sweet (the blood sugar is higher). It is also important to drink plenty of fluids (well hydrated) before doing the count. Lie on your left side because it helps with the circulation or you can sit in a comfortable chair with your arms over your belly (abdomen) with no distractions around you. DOING THE COUNT  Try to do the count the same time of day each time you do it.  Mark the day and time, then see how long it takes for you to feel 10 movements (kicks, flutters, swishes, rolls). You should have at least 10 movements within 2 hours. You will most likely feel 10 movements in much less than 2 hours. If you do not, wait an hour and count again. After a couple of days you will see a pattern.  What you are looking for is a change in the pattern or not enough counts in 2 hours. Is it taking longer in time to reach 10 movements? SEEK MEDICAL CARE IF:  You feel less than 10 counts in 2 hours. Tried twice.  No movement in one hour.  The pattern is changing or taking longer each day to reach 10 counts in 2 hours.  You feel the baby is not moving as it usually does. Date: ____________ Movements: ____________ Start time: ____________ Finish time: ____________  Date: ____________ Movements: ____________ Start time: ____________ Finish time: ____________ Date: ____________ Movements: ____________ Start time: ____________ Finish time: ____________ Date: ____________ Movements: ____________ Start time: ____________ Finish time: ____________ Date: ____________ Movements: ____________ Start time: ____________ Finish time:  ____________ Date: ____________ Movements: ____________ Start time: ____________ Finish time: ____________ Date: ____________ Movements: ____________ Start time: ____________ Finish time: ____________ Date: ____________ Movements: ____________ Start time: ____________ Finish time: ____________  Date: ____________ Movements: ____________ Start time: ____________ Finish time: ____________ Date: ____________ Movements: ____________ Start time: ____________ Finish time: ____________ Date: ____________ Movements: ____________ Start time: ____________ Finish time: ____________ Date: ____________ Movements: ____________ Start time: ____________ Finish time: ____________ Date: ____________ Movements: ____________ Start time: ____________ Finish time: ____________ Date: ____________ Movements: ____________ Start time: ____________ Finish time: ____________ Date: ____________ Movements: ____________ Start time: ____________ Finish time: ____________  Date: ____________ Movements: ____________ Start time: ____________ Finish time: ____________ Date: ____________ Movements: ____________ Start time: ____________ Finish time: ____________ Date: ____________ Movements: ____________ Start time: ____________ Finish time: ____________ Date: ____________ Movements: ____________ Start time: ____________ Finish time: ____________ Date: ____________ Movements: ____________ Start time: ____________ Finish time: ____________ Date: ____________ Movements: ____________ Start time: ____________ Finish time: ____________ Date: ____________ Movements: ____________ Start time: ____________ Finish time: ____________  Date: ____________ Movements: ____________ Start time: ____________ Finish time: ____________ Date: ____________ Movements: ____________ Start time: ____________ Finish time: ____________ Date: ____________ Movements: ____________ Start time: ____________ Finish time: ____________ Date: ____________ Movements:  ____________ Start time: ____________ Finish time: ____________ Date: ____________ Movements: ____________ Start time: ____________ Finish time: ____________ Date: ____________ Movements: ____________ Start time: ____________ Finish time: ____________ Date: ____________ Movements: ____________ Start time: ____________ Finish time: ____________  Date: ____________ Movements: ____________ Start time: ____________ Finish time: ____________ Date: ____________ Movements: ____________ Start time: ____________ Finish time: ____________ Date: ____________ Movements: ____________ Start time: ____________ Finish time: ____________ Date: ____________ Movements:   ____________ Start time: ____________ Finish time: ____________ Date: ____________ Movements: ____________ Start time: ____________ Finish time: ____________ Date: ____________ Movements: ____________ Start time: ____________ Finish time: ____________ Date: ____________ Movements: ____________ Start time: ____________ Finish time: ____________  Date: ____________ Movements: ____________ Start time: ____________ Finish time: ____________ Date: ____________ Movements: ____________ Start time: ____________ Finish time: ____________ Date: ____________ Movements: ____________ Start time: ____________ Finish time: ____________ Date: ____________ Movements: ____________ Start time: ____________ Finish time: ____________ Date: ____________ Movements: ____________ Start time: ____________ Finish time: ____________ Date: ____________ Movements: ____________ Start time: ____________ Finish time: ____________ Date: ____________ Movements: ____________ Start time: ____________ Finish time: ____________  Date: ____________ Movements: ____________ Start time: ____________ Finish time: ____________ Date: ____________ Movements: ____________ Start time: ____________ Finish time: ____________ Date: ____________ Movements: ____________ Start time: ____________ Finish  time: ____________ Date: ____________ Movements: ____________ Start time: ____________ Finish time: ____________ Date: ____________ Movements: ____________ Start time: ____________ Finish time: ____________ Date: ____________ Movements: ____________ Start time: ____________ Finish time: ____________ Date: ____________ Movements: ____________ Start time: ____________ Finish time: ____________  Date: ____________ Movements: ____________ Start time: ____________ Finish time: ____________ Date: ____________ Movements: ____________ Start time: ____________ Finish time: ____________ Date: ____________ Movements: ____________ Start time: ____________ Finish time: ____________ Date: ____________ Movements: ____________ Start time: ____________ Finish time: ____________ Date: ____________ Movements: ____________ Start time: ____________ Finish time: ____________ Date: ____________ Movements: ____________ Start time: ____________ Finish time: ____________ Document Released: 01/05/2007 Document Revised: 02/28/2012 Document Reviewed: 07/08/2009 [employer] Patient Information 2013 [employer].  

## 2013-02-22 NOTE — Progress Notes (Signed)
[redacted]w[redacted]d Doing well, but noticed significant decreased fm x 24 hours.  Only about 5 times total in 2 hours span throughout the day.  Also, current yeast infection and treating herself with OTC cream.  If no improvement will check next week. GBS collected.  NST reactive, 145 with moderate variability 15 x 15 accels, no contractions. ROB in 1 week L.Montez Morita, FNP-BC

## 2013-02-25 LAB — CULTURE, BETA STREP (GROUP B ONLY)

## 2013-02-27 ENCOUNTER — Ambulatory Visit: Payer: 59 | Admitting: Obstetrics and Gynecology

## 2013-02-27 VITALS — BP 98/52 | Wt 181.0 lb

## 2013-02-27 DIAGNOSIS — Z3490 Encounter for supervision of normal pregnancy, unspecified, unspecified trimester: Secondary | ICD-10-CM | POA: Insufficient documentation

## 2013-02-27 NOTE — Progress Notes (Signed)
[redacted]w[redacted]d  Pt stated she wants a cervix check today.

## 2013-03-05 ENCOUNTER — Telehealth: Payer: Self-pay | Admitting: Obstetrics and Gynecology

## 2013-03-05 ENCOUNTER — Inpatient Hospital Stay (HOSPITAL_COMMUNITY)
Admission: AD | Admit: 2013-03-05 | Discharge: 2013-03-05 | Disposition: A | Discharge status: Home / Self Care | Payer: 59 | Source: Ambulatory Visit | Attending: Obstetrics and Gynecology | Admitting: Obstetrics and Gynecology

## 2013-03-05 ENCOUNTER — Encounter (HOSPITAL_COMMUNITY): Payer: Self-pay | Admitting: *Deleted

## 2013-03-05 ENCOUNTER — Other Ambulatory Visit: Payer: Self-pay | Admitting: Obstetrics and Gynecology

## 2013-03-05 DIAGNOSIS — B3731 Acute candidiasis of vulva and vagina: Secondary | ICD-10-CM | POA: Insufficient documentation

## 2013-03-05 DIAGNOSIS — IMO0002 Reserved for concepts with insufficient information to code with codable children: Secondary | ICD-10-CM | POA: Insufficient documentation

## 2013-03-05 DIAGNOSIS — R51 Headache: Secondary | ICD-10-CM | POA: Insufficient documentation

## 2013-03-05 DIAGNOSIS — B373 Candidiasis of vulva and vagina: Secondary | ICD-10-CM | POA: Insufficient documentation

## 2013-03-05 DIAGNOSIS — L293 Anogenital pruritus, unspecified: Secondary | ICD-10-CM | POA: Insufficient documentation

## 2013-03-05 DIAGNOSIS — O2390 Unspecified genitourinary tract infection in pregnancy, unspecified trimester: Secondary | ICD-10-CM | POA: Insufficient documentation

## 2013-03-05 LAB — URINE MICROSCOPIC-ADD ON

## 2013-03-05 LAB — CBC
HCT: 37.2 % (ref 36.0–46.0)
MCHC: 33.1 g/dL (ref 30.0–36.0)
Platelets: 192 10*3/uL (ref 150–400)
RDW: 14.8 % (ref 11.5–15.5)
WBC: 7.1 10*3/uL (ref 4.0–10.5)

## 2013-03-05 LAB — COMPREHENSIVE METABOLIC PANEL
AST: 19 U/L (ref 0–37)
Albumin: 2.7 g/dL — ABNORMAL LOW (ref 3.5–5.2)
Alkaline Phosphatase: 171 U/L — ABNORMAL HIGH (ref 39–117)
BUN: 8 mg/dL (ref 6–23)
Chloride: 102 mEq/L (ref 96–112)
Creatinine, Ser: 0.62 mg/dL (ref 0.50–1.10)
Potassium: 4.2 mEq/L (ref 3.5–5.1)
Total Protein: 6.6 g/dL (ref 6.0–8.3)

## 2013-03-05 LAB — WET PREP, GENITAL
Clue Cells Wet Prep HPF POC: NONE SEEN
Trich, Wet Prep: NONE SEEN

## 2013-03-05 LAB — URINALYSIS, ROUTINE W REFLEX MICROSCOPIC
Bilirubin Urine: NEGATIVE
Glucose, UA: NEGATIVE mg/dL
Hgb urine dipstick: NEGATIVE
Specific Gravity, Urine: <1.005 — ABNORMAL LOW (ref 1.005–1.030)
pH: 6 (ref 5.0–8.0)

## 2013-03-05 LAB — URIC ACID: Uric Acid, Serum: 5.5 mg/dL (ref 2.4–7.0)

## 2013-03-05 LAB — LACTATE DEHYDROGENASE: LDH: 176 U/L (ref 94–250)

## 2013-03-05 MED ORDER — TERCONAZOLE 0.4 % VA CREA: 1.0000 | TOPICAL_CREAM | Freq: Every day | VAGINAL | Status: DC

## 2013-03-05 NOTE — Discharge Instructions (Signed)
Hypertension During Pregnancy Hypertension is also called high blood pressure. It can occur at any time in life and during pregnancy. When you have hypertension, there is extra pressure inside your blood vessels that carry blood from the heart to the rest of your body (arteries). Hypertension during pregnancy can cause problems for you and your baby. Your baby might not weigh as much as it should at birth or might be born early (premature). Very bad cases of hypertension during pregnancy can be life-threatening.  There are different types of hypertension during pregnancy.   Chronic hypertension. This happens when a woman has hypertension before pregnancy and it continues during pregnancy.  Gestational hypertension. This is when hypertension develops during pregnancy.  Preeclampsia or toxemia of pregnancy. This is a very serious type of hypertension that develops only during pregnancy. It is a disease that affects the whole body (systemic) and can be very dangerous for both mother and baby.  Gestational hypertension and preeclampsia usually go away after your baby is born. Blood pressure generally stabilizes within 6 weeks. Women who have hypertension during pregnancy have a greater chance of developing hypertension later in life or with future pregnancies. UNDERSTANDING BLOOD PRESSURE Blood pressure moves blood in your body. Sometimes, the force that moves the blood becomes too strong.  A blood pressure reading is given in 2 numbers and looks like a fraction.  The top number is called the systolic pressure. When your heart beats, it forces more blood to flow through the arteries. Pressure inside the arteries goes up.  The bottom number is the diastolic pressure. Pressure goes down between beats. That is when the heart is resting.  You may have hypertension if:  Your systolic blood pressure is above 140.  Your diastolic pressure is above 90. RISK FACTORS Some factors make you more likely to  develop hypertension during pregnancy. Risk factors include:  Having hypertension before pregnancy.  Having hypertension during a previous pregnancy.  Being overweight.  Being older than 8.  Being pregnant with more than 1 baby (multiples).  Having diabetes or kidney problems. SYMPTOMS Chronic and gestational hypertension may not cause symptoms. Preeclampsia has symptoms, which may include:  Increased protein in your urine. Your caregiver will check for this at every prenatal visit.  Swelling of your hands and face.  Rapid weight gain.  Headaches.  Visual changes.  Being bothered by light.  Abdominal pain, especially in the right upper area.  Chest pain.  Shortness of breath.  Increased reflexes.  Seizures. Seizures occur with a more severe form of preeclampsia, called eclampsia. DIAGNOSIS   You may be diagnosed with hypertension during pregnancy during a regular prenatal exam. At each visit, tests may include:  Blood pressure checks.  A urine test to check for protein in your urine.  The type of hypertension you are diagnosed with depends on when you developed it. It also depends on your specific blood pressure reading.  Developing hypertension before 20 weeks of pregnancy is consistent with chronic hypertension.  Developing hypertension after 20 weeks of pregnancy is consistent with gestational hypertension.  Hypertension with increased urinary protein is diagnosed as preeclampsia.  Blood pressure measurements that stay above 045 systolic or 409 diastolic are a sign of severe preeclampsia. TREATMENT Treatment for hypertension during pregnancy varies. Treatment depends on the type of hypertension and how serious it is.  If you take medicine for chronic hypertension, you may need to switch medicines.  Drugs called ACE inhibitors should not be taken during pregnancy.  Low-dose aspirin may be suggested for women who have risk factors for preeclampsia.  If  you have gestational hypertension, you may need to take a blood pressure medicine that is safe during pregnancy. Your caregiver will recommend the appropriate medicine.  If you have severe preeclampsia, you may need to be in the hospital. Caregivers will watch you and the baby very closely. You also may need to take medicine (magnesium sulfate) to prevent seizures and lower blood pressure.  Sometimes an early delivery is needed. This may be the case if the condition worsens. It would be done to protect you and the baby. The only cure for preeclampsia is delivery. HOME CARE INSTRUCTIONS  Schedule and keep all of your regular prenatal care.  Follow your caregiver's instructions for taking medicines. Tell your caregiver about all medicines you take. This includes over-the-counter medicines.  Eat as little salt as possible.  Get regular exercise.  Do not drink alcohol.  Do not use tobacco products.  Do not drink products with caffeine.  Lie on your left side when resting.  Tell your doctor if you have any preeclampsia symptoms. SEEK IMMEDIATE MEDICAL CARE IF:  You have severe abdominal pain.  You have sudden swelling in the hands, ankles, or face.  You gain 4 pounds (1.8 kg) or more in 1 week.  You vomit repeatedly.  You have vaginal bleeding.  You do not feel the baby moving as much.  You have a headache.  You have blurred or double vision.  You have muscle twitching or spasms.  You have shortness of breath.  You have blue fingernails and lips.  You have blood in your urine. MAKE SURE YOU:  Understand these instructions.  Will watch your condition.  Will get help right away if you are not doing well. Document Released: 08/24/2011 Document Revised: 02/28/2012 Document Reviewed: 08/24/2011 [facility] Patient Information 2013 [place], [place].   Braxton Hicks Contractions Pregnancy is commonly associated with contractions of the uterus throughout the pregnancy.  Towards the end of pregnancy (32 to 34 weeks), these contractions [facility]) can develop more often and may become more forceful. This is not true labor because these contractions do not result in opening (dilatation) and thinning of the cervix. They are sometimes difficult to tell apart from true labor because these contractions can be forceful and people have different pain tolerances. You should not feel embarrassed if you go to the hospital with false labor. Sometimes, the only way to tell if you are in true labor is for your caregiver to follow the changes in the cervix. How to tell the difference between true and false labor:  False labor.  The contractions of false labor are usually shorter, irregular and not as hard as those of true labor.  They are often felt in the front of the lower abdomen and in the groin.  They may leave with walking around or changing positions while lying down.  They get weaker and are shorter lasting as time goes on.  These contractions are usually irregular.  They do not usually become progressively stronger, regular and closer together as with true labor.  True labor.  Contractions in true labor last 30 to 70 seconds, become very regular, usually become more intense, and increase in frequency.  They do not go away with walking.  The discomfort is usually felt in the top of the uterus and spreads to the lower abdomen and low back.  True labor can be determined by your caregiver with  an exam. This will show that the cervix is dilating and getting thinner. If there are no prenatal problems or other health problems associated with the pregnancy, it is completely safe to be sent home with false labor and await the onset of true labor. HOME CARE INSTRUCTIONS   Keep up with your usual exercises and instructions.  Take medications as directed.  Keep your regular prenatal appointment.  Eat and drink lightly if you think you are going into  labor.  If BH contractions are making you uncomfortable:  Change your activity position from lying down or resting to walking/walking to resting.  Sit and rest in a tub of warm water.  Drink 2 to 3 glasses of water. Dehydration may cause B-H contractions.  Do slow and deep breathing several times an hour. SEEK IMMEDIATE MEDICAL CARE IF:   Your contractions continue to become stronger, more regular, and closer together.  You have a gushing, burst or leaking of fluid from the vagina.  An oral temperature above 102 F (38.9 C) develops.  You have passage of blood-tinged mucus.  You develop vaginal bleeding.  You develop continuous belly (abdominal) pain.  You have low back pain that you never had before.  You feel the baby's head pushing down causing pelvic pressure.  The baby is not moving as much as it used to. Document Released: 12/06/2005 Document Revised: 02/28/2012 Document Reviewed: 05/30/2009 [facility] Patient Information 2013 [place], [place].

## 2013-03-05 NOTE — MAU Provider Note (Signed)
History  27yo G2P0010 at [redacted]w[redacted]d presents after calling office c/o increased edema, HA, and visual changes.  Reports FM but feels it has been less.  She reports that she has a history of HAs.  No medications were able to releive these HAs but was able to find relief with chiropractics She also has noted a recent increase in yellow discharge with vaginal itching.    Chief Complaint  Patient presents with  . Hypertension  . Vaginal Discharge  . Headache  . Labor Eval   Patient Active Problem List  Diagnosis  . Vaginal yeast infection  . Penicillin allergy  . Pregnancy    OB History   Grav Para Term Preterm Abortions TAB SAB Ect Mult Living   2    1  0         Past Medical History  Diagnosis Date  . H/O nausea 2011  . BV (bacterial vaginosis) 2011  . HPV in female   . Hx: UTI (urinary tract infection)   . Candida vaginitis 03/15/12  . Abnormal Pap smear 04/2009    Colpo;was normal;Last pap 04/2011  . Headache     in the past  . Anemia 2005;2008    needed iron supplements    Past Surgical History  Procedure Laterality Date  . Wisdom tooth extraction  2004    All 4 are removed    Family History  Problem Relation Age of Onset  . Hypertension Maternal Grandmother   . Hypertension Maternal Grandfather   . Hypertension Paternal Grandmother   . Hypertension Paternal Grandfather   . Hypertension Maternal Uncle   . Hypertension Paternal Uncle   . Diabetes Father   . Diabetes Maternal Grandfather   . Diabetes Maternal Uncle   . Diabetes Paternal Uncle   . Heart disease Maternal Grandfather   . Heart disease Maternal Uncle   . Alzheimer's disease Paternal Grandmother   . Seizures Maternal Uncle   . Aneurysm Maternal Grandmother   . Cancer Maternal Uncle     Eye  . Kidney failure Mother     d/t [place] Brun[place]  . Cancer Mother     Leukemia  . Nephrolithiasis Maternal Uncle   . Cancer Maternal Uncle     brain  . Cancer Maternal Uncle     lung  . Rheum arthritis Paternal  Uncle   . Multiple sclerosis Cousin     maternal  . Cancer Other     FOB father :walden strum's    History  Substance Use Topics  . Smoking status: Never Smoker   . Smokeless tobacco: Never Used  . Alcohol Use: No    Allergies:  Allergies  Allergen Reactions  . Penicillins     Childhood allergy    Prescriptions prior to admission  Medication Sig Dispense Refill  . Docosahexaenoic Acid (DHA OMEGA 3 PO) Take 2 tablets by mouth daily.      . fish oil-omega-3 fatty acids 1000 MG capsule Take 2 g by mouth daily.      . fluconazole (DIFLUCAN) 100 MG tablet Take 1 tablet (100 mg total) by mouth daily.  1 tablet  2  . metoCLOPramide (REGLAN) 10 MG tablet Take 1 tablet (10 mg total) by mouth every 6 (six) hours as needed.  30 tablet  0  . Prenatal Vit-Fe Sulfate-FA (PRENATAL VITAMIN PO) Take 3 tablets by mouth daily.         Physical Exam   Blood pressure 121/69, pulse 88, resp. rate 16, last  menstrual period 06/17/2012.  Chest: Clear Heart: RRR without murmur Abdomen: Gravid, NT Pelvic: Cx is 1cm / 75% / 0 to -1 station / vtx    Curdy d/c in vault Extremities: Slight edema, non-pitting DTR: 2+, no clonus  FHT: Cat 1 UCs: 5 mins; mild  ED Course  IUP at [redacted]w[redacted]d HA, visual sx, and edema - normotensive Likely yeast vaginitis  CBC, Uric acid, CMP, LDH Wet prep  Artemisa Sladek CNM, [place] 03/05/2013 12:46 PM   Addendum:  Pt feels headaches are manageable - declines additional rx.  Results for orders placed during the hospital encounter of 03/05/13 (from the past 24 hour(s))  URINALYSIS, ROUTINE W REFLEX MICROSCOPIC     Status: Abnormal   Collection Time    03/05/13 11:00 AM      Result Value Range   Color, Urine YELLOW  YELLOW   APPearance CLEAR  CLEAR   Specific Gravity, Urine <1.005 (*) 1.005 - 1.030   pH 6.0  5.0 - 8.0   Glucose, UA NEGATIVE  NEGATIVE mg/dL   Hgb urine dipstick NEGATIVE  NEGATIVE   Bilirubin Urine NEGATIVE  NEGATIVE   Ketones, ur NEGATIVE   NEGATIVE mg/dL   Protein, ur NEGATIVE  NEGATIVE mg/dL   Urobilinogen, UA 0.2  0.0 - 1.0 mg/dL   Nitrite NEGATIVE  NEGATIVE   Leukocytes, UA MODERATE (*) NEGATIVE  URINE MICROSCOPIC-ADD ON     Status: Abnormal   Collection Time    03/05/13 11:00 AM      Result Value Range   Squamous Epithelial / LPF FEW (*) RARE   WBC, UA 3-6  <3 WBC/hpf   RBC / HPF 3-6  <3 RBC/hpf   Bacteria, UA FEW (*) RARE   Urine-Other RARE YEAST    WET PREP, GENITAL     Status: Abnormal   Collection Time    03/05/13 12:40 PM      Result Value Range   Yeast Wet Prep HPF POC MANY (*) NONE SEEN   Trich, Wet Prep NONE SEEN  NONE SEEN   Clue Cells Wet Prep HPF POC NONE SEEN  NONE SEEN   WBC, Wet Prep HPF POC MANY (*) NONE SEEN  CBC     Status: None   Collection Time    03/05/13  1:05 PM      Result Value Range   WBC 7.1  4.0 - 10.5 K/uL   RBC 4.22  3.87 - 5.11 MIL/uL   Hemoglobin 12.3  12.0 - 15.0 g/dL   HCT 19.1  47.8 - 29.5 %   MCV 88.2  78.0 - 100.0 fL   MCH 29.1  26.0 - 34.0 pg   MCHC 33.1  30.0 - 36.0 g/dL   RDW 62.1  30.8 - 65.7 %   Platelets 192  150 - 400 K/uL  COMPREHENSIVE METABOLIC PANEL     Status: Abnormal   Collection Time    03/05/13  1:05 PM      Result Value Range   Sodium 136  135 - 145 mEq/L   Potassium 4.2  3.5 - 5.1 mEq/L   Chloride 102  96 - 112 mEq/L   CO2 21  19 - 32 mEq/L   Glucose, Bld 69 (*) 70 - 99 mg/dL   BUN 8  6 - 23 mg/dL   Creatinine, Ser 8.46  0.50 - 1.10 mg/dL   Calcium 9.7  8.4 - 96.2 mg/dL   Total Protein 6.6  6.0 - 8.3 g/dL   Albumin  2.7 (*) 3.5 - 5.2 g/dL   AST 19  0 - 37 U/L   ALT 17  0 - 35 U/L   Alkaline Phosphatase 171 (*) 39 - 117 U/L   Total Bilirubin 0.2 (*) 0.3 - 1.2 mg/dL   GFR calc non Af Amer >90  >90 mL/min   GFR calc Af Amer >90  >90 mL/min  LACTATE DEHYDROGENASE     Status: None   Collection Time    03/05/13  1:05 PM      Result Value Range   LDH 176  94 - 250 U/L  URIC ACID     Status: None   Collection Time    03/05/13  1:05 PM       Result Value Range   Uric Acid, Serum 5.5  2.4 - 7.0 mg/dL   Filed Vitals:   16/10/96 1145 03/05/13 1200 03/05/13 1215 03/05/13 1229  BP: 123/80 117/66 114/72 121/69  Pulse:      TempSrc:      Resp:       Wet prep: + yeast C/w Dr Stefano Gaul Will defer 24 hour urine at present Increase rest this week - note given for work, OOW til office visit 3/20 Keep office appt on 3/20 PIH precautions rv'd Rx Terazol 7 QD x7 days  Nigel Bridgeman CNM, [place] 03/05/13 at  14:10

## 2013-03-05 NOTE — Telephone Encounter (Signed)
TC from pt. Pt states having edema, headaches and blurred vision since last PM. +FM Irreg cont. Yellow vag D/C. Per [facility], pt to MAU. JO, CNM notified. Pt verbalizes comprehension.

## 2013-03-08 ENCOUNTER — Ambulatory Visit: Payer: 59 | Admitting: Obstetrics and Gynecology

## 2013-03-08 ENCOUNTER — Encounter: Payer: Self-pay | Admitting: Obstetrics and Gynecology

## 2013-03-08 VITALS — BP 122/68 | Wt 181.0 lb

## 2013-03-08 DIAGNOSIS — Z3490 Encounter for supervision of normal pregnancy, unspecified, unspecified trimester: Secondary | ICD-10-CM

## 2013-03-08 DIAGNOSIS — Z880 Allergy status to penicillin: Secondary | ICD-10-CM

## 2013-03-08 DIAGNOSIS — O368131 Decreased fetal movements, third trimester, fetus 1: Secondary | ICD-10-CM

## 2013-03-08 NOTE — Progress Notes (Signed)
Pt stated in the past couple of days decrease in FM . Pt also stated still has some discharge yellow in color. On meds for yeast infection per pt. Pt wants a cervix check today.  Will d/c work

## 2013-03-08 NOTE — Patient Instructions (Signed)
Fetal Movement Counts Patient Name: __________________________________________________ Patient Due Date: ____________________ Kick counts is highly recommended in high risk pregnancies, but it is a good idea for every pregnant woman to do. Start counting fetal movements at 28 weeks of the pregnancy. Fetal movements increase after eating a full meal or eating or drinking something sweet (the blood sugar is higher). It is also important to drink plenty of fluids (well hydrated) before doing the count. Lie on your left side because it helps with the circulation or you can sit in a comfortable chair with your arms over your belly (abdomen) with no distractions around you. DOING THE COUNT  Try to do the count the same time of day each time you do it.  Mark the day and time, then see how long it takes for you to feel 10 movements (kicks, flutters, swishes, rolls). You should have at least 10 movements within 2 hours. You will most likely feel 10 movements in much less than 2 hours. If you do not, wait an hour and count again. After a couple of days you will see a pattern.  What you are looking for is a change in the pattern or not enough counts in 2 hours. Is it taking longer in time to reach 10 movements? SEEK MEDICAL CARE IF:  You feel less than 10 counts in 2 hours. Tried twice.  No movement in one hour.  The pattern is changing or taking longer each day to reach 10 counts in 2 hours.  You feel the baby is not moving as it usually does. Date: ____________ Movements: ____________ Start time: ____________ Finish time: ____________  Date: ____________ Movements: ____________ Start time: ____________ Finish time: ____________ Date: ____________ Movements: ____________ Start time: ____________ Finish time: ____________ Date: ____________ Movements: ____________ Start time: ____________ Finish time: ____________ Date: ____________ Movements: ____________ Start time: ____________ Finish time:  ____________ Date: ____________ Movements: ____________ Start time: ____________ Finish time: ____________ Date: ____________ Movements: ____________ Start time: ____________ Finish time: ____________ Date: ____________ Movements: ____________ Start time: ____________ Finish time: ____________  Date: ____________ Movements: ____________ Start time: ____________ Finish time: ____________ Date: ____________ Movements: ____________ Start time: ____________ Finish time: ____________ Date: ____________ Movements: ____________ Start time: ____________ Finish time: ____________ Date: ____________ Movements: ____________ Start time: ____________ Finish time: ____________ Date: ____________ Movements: ____________ Start time: ____________ Finish time: ____________ Date: ____________ Movements: ____________ Start time: ____________ Finish time: ____________ Date: ____________ Movements: ____________ Start time: ____________ Finish time: ____________  Date: ____________ Movements: ____________ Start time: ____________ Finish time: ____________ Date: ____________ Movements: ____________ Start time: ____________ Finish time: ____________ Date: ____________ Movements: ____________ Start time: ____________ Finish time: ____________ Date: ____________ Movements: ____________ Start time: ____________ Finish time: ____________ Date: ____________ Movements: ____________ Start time: ____________ Finish time: ____________ Date: ____________ Movements: ____________ Start time: ____________ Finish time: ____________ Date: ____________ Movements: ____________ Start time: ____________ Finish time: ____________  Date: ____________ Movements: ____________ Start time: ____________ Finish time: ____________ Date: ____________ Movements: ____________ Start time: ____________ Finish time: ____________ Date: ____________ Movements: ____________ Start time: ____________ Finish time: ____________ Date: ____________ Movements:  ____________ Start time: ____________ Finish time: ____________ Date: ____________ Movements: ____________ Start time: ____________ Finish time: ____________ Date: ____________ Movements: ____________ Start time: ____________ Finish time: ____________ Date: ____________ Movements: ____________ Start time: ____________ Finish time: ____________  Date: ____________ Movements: ____________ Start time: ____________ Finish time: ____________ Date: ____________ Movements: ____________ Start time: ____________ Finish time: ____________ Date: ____________ Movements: ____________ Start time: ____________ Finish time: ____________ Date: ____________ Movements:   ____________ Start time: ____________ Finish time: ____________ Date: ____________ Movements: ____________ Start time: ____________ Finish time: ____________ Date: ____________ Movements: ____________ Start time: ____________ Finish time: ____________ Date: ____________ Movements: ____________ Start time: ____________ Finish time: ____________  Date: ____________ Movements: ____________ Start time: ____________ Finish time: ____________ Date: ____________ Movements: ____________ Start time: ____________ Finish time: ____________ Date: ____________ Movements: ____________ Start time: ____________ Finish time: ____________ Date: ____________ Movements: ____________ Start time: ____________ Finish time: ____________ Date: ____________ Movements: ____________ Start time: ____________ Finish time: ____________ Date: ____________ Movements: ____________ Start time: ____________ Finish time: ____________ Date: ____________ Movements: ____________ Start time: ____________ Finish time: ____________  Date: ____________ Movements: ____________ Start time: ____________ Finish time: ____________ Date: ____________ Movements: ____________ Start time: ____________ Finish time: ____________ Date: ____________ Movements: ____________ Start time: ____________ Finish  time: ____________ Date: ____________ Movements: ____________ Start time: ____________ Finish time: ____________ Date: ____________ Movements: ____________ Start time: ____________ Finish time: ____________ Date: ____________ Movements: ____________ Start time: ____________ Finish time: ____________ Date: ____________ Movements: ____________ Start time: ____________ Finish time: ____________  Date: ____________ Movements: ____________ Start time: ____________ Finish time: ____________ Date: ____________ Movements: ____________ Start time: ____________ Finish time: ____________ Date: ____________ Movements: ____________ Start time: ____________ Finish time: ____________ Date: ____________ Movements: ____________ Start time: ____________ Finish time: ____________ Date: ____________ Movements: ____________ Start time: ____________ Finish time: ____________ Date: ____________ Movements: ____________ Start time: ____________ Finish time: ____________ Document Released: 01/05/2007 Document Revised: 02/28/2012 Document Reviewed: 07/08/2009 [employer] Patient Information 2013 [employer].  

## 2013-03-22 ENCOUNTER — Encounter (HOSPITAL_COMMUNITY): Payer: Self-pay | Admitting: Anesthesiology

## 2013-03-22 ENCOUNTER — Encounter (HOSPITAL_COMMUNITY): Payer: Self-pay | Admitting: *Deleted

## 2013-03-22 ENCOUNTER — Inpatient Hospital Stay (HOSPITAL_COMMUNITY)
Admission: AD | Admit: 2013-03-22 | Discharge: 2013-03-24 | DRG: 775 | Disposition: A | Discharge status: Home / Self Care | Payer: 59 | Source: Ambulatory Visit | Attending: Obstetrics and Gynecology | Admitting: Obstetrics and Gynecology

## 2013-03-22 ENCOUNTER — Inpatient Hospital Stay (HOSPITAL_COMMUNITY): Payer: 59 | Admitting: Anesthesiology

## 2013-03-22 DIAGNOSIS — IMO0001 Reserved for inherently not codable concepts without codable children: Secondary | ICD-10-CM

## 2013-03-22 DIAGNOSIS — Z880 Allergy status to penicillin: Secondary | ICD-10-CM

## 2013-03-22 LAB — CBC
MCH: 30.2 pg (ref 26.0–34.0)
MCV: 87.8 fL (ref 78.0–100.0)
Platelets: 157 10*3/uL (ref 150–400)
RDW: 15.2 % (ref 11.5–15.5)
WBC: 7.3 10*3/uL (ref 4.0–10.5)

## 2013-03-22 MED ORDER — OXYCODONE-ACETAMINOPHEN 5-325 MG PO TABS: 1.0000 | ORAL_TABLET | ORAL | Status: DC | PRN

## 2013-03-22 MED ORDER — FENTANYL 2.5 MCG/ML BUPIVACAINE 1/10 % EPIDURAL INFUSION (WH - ANES)
14.0000 mL/h | INTRAMUSCULAR | Status: DC | PRN
  Administered 2013-03-22 (×2): 14 mL/h via EPIDURAL
  Filled 2013-03-22 (×2): qty 125

## 2013-03-22 MED ORDER — LACTATED RINGERS IV SOLN: 500.0000 mL | Freq: Once | INTRAVENOUS | Status: DC

## 2013-03-22 MED ORDER — OXYTOCIN 40 UNITS IN LACTATED RINGERS INFUSION - SIMPLE MED
62.5000 mL/h | INTRAVENOUS | Status: DC
  Administered 2013-03-22: 62.5 mL/h via INTRAVENOUS

## 2013-03-22 MED ORDER — EPHEDRINE 5 MG/ML INJ
10.0000 mg | INTRAVENOUS | Status: DC | PRN
  Filled 2013-03-22: qty 4
  Filled 2013-03-22: qty 2

## 2013-03-22 MED ORDER — LACTATED RINGERS IV SOLN: 500.0000 mL | INTRAVENOUS | Status: DC | PRN

## 2013-03-22 MED ORDER — LIDOCAINE HCL (PF) 1 % IJ SOLN
30.0000 mL | INTRAMUSCULAR | Status: DC | PRN
  Administered 2013-03-22: 30 mL via SUBCUTANEOUS
  Filled 2013-03-22 (×2): qty 30

## 2013-03-22 MED ORDER — ONDANSETRON HCL 4 MG/2ML IJ SOLN: 4.0000 mg | Freq: Four times a day (QID) | INTRAMUSCULAR | Status: DC | PRN

## 2013-03-22 MED ORDER — PHENYLEPHRINE 40 MCG/ML (10ML) SYRINGE FOR IV PUSH (FOR BLOOD PRESSURE SUPPORT)
80.0000 ug | PREFILLED_SYRINGE | INTRAVENOUS | Status: DC | PRN
  Filled 2013-03-22: qty 2

## 2013-03-22 MED ORDER — CITRIC ACID-SODIUM CITRATE 334-500 MG/5ML PO SOLN: 30.0000 mL | ORAL | Status: DC | PRN

## 2013-03-22 MED ORDER — PHENYLEPHRINE 40 MCG/ML (10ML) SYRINGE FOR IV PUSH (FOR BLOOD PRESSURE SUPPORT)
80.0000 ug | PREFILLED_SYRINGE | INTRAVENOUS | Status: DC | PRN
  Filled 2013-03-22: qty 5
  Filled 2013-03-22: qty 2

## 2013-03-22 MED ORDER — IBUPROFEN 600 MG PO TABS: 600.0000 mg | ORAL_TABLET | Freq: Four times a day (QID) | ORAL | Status: DC | PRN

## 2013-03-22 MED ORDER — ACETAMINOPHEN 325 MG PO TABS: 650.0000 mg | ORAL_TABLET | ORAL | Status: DC | PRN

## 2013-03-22 MED ORDER — EPHEDRINE 5 MG/ML INJ
10.0000 mg | INTRAVENOUS | Status: DC | PRN
  Filled 2013-03-22: qty 2

## 2013-03-22 MED ORDER — OXYTOCIN 40 UNITS IN LACTATED RINGERS INFUSION - SIMPLE MED
1.0000 m[IU]/min | INTRAVENOUS | Status: DC
  Administered 2013-03-22: 2 m[IU]/min via INTRAVENOUS
  Filled 2013-03-22: qty 1000

## 2013-03-22 MED ORDER — LACTATED RINGERS IV SOLN: INTRAVENOUS | Status: DC

## 2013-03-22 MED ORDER — OXYTOCIN BOLUS FROM INFUSION: 500.0000 mL | INTRAVENOUS | Status: DC

## 2013-03-22 MED ORDER — TERBUTALINE SULFATE 1 MG/ML IJ SOLN: 0.2500 mg | Freq: Once | INTRAMUSCULAR | Status: AC | PRN

## 2013-03-22 MED ORDER — BUTORPHANOL TARTRATE 1 MG/ML IJ SOLN: 1.0000 mg | INTRAMUSCULAR | Status: DC | PRN

## 2013-03-22 MED ORDER — DIPHENHYDRAMINE HCL 50 MG/ML IJ SOLN: 12.5000 mg | INTRAMUSCULAR | Status: DC | PRN

## 2013-03-22 MED ORDER — LIDOCAINE HCL (PF) 1 % IJ SOLN
INTRAMUSCULAR | Status: DC | PRN
  Administered 2013-03-22 (×2): 5 mL

## 2013-03-22 NOTE — Progress Notes (Signed)
Patient ID: Tasha Collins, female   DOB: November 01, 1986, 27 y.o.   MRN: [id number] Tasha Collins is a 27 y.o. G2P0010 at [redacted]w[redacted]d admitted for SROM/early labor  Subjective: Comfortable w epidural, feel some pressure  Objective: BP 113/75  Pulse 108  Temp(Src) 99 F (37.2 C) (Axillary)  Resp 18  Ht 5\' 5"  (1.651 m)  Wt 185 lb 3.2 oz (84.006 kg)  BMI 30.82 kg/m2  SpO2 99%  LMP 06/17/2012     FHT:  FHR: 150 bpm, variability: moderate,  accelerations:  Present,  decelerations:  Present occ variable UC:   regular, every 3-4 minutes, not tracing well at times  SVE:   Dilation: 10 Effacement (%): 100 Station: +1 Exam by:: Tasha Collins cnm  sm forebag arom'd, w bloody show  Assessment / Plan: Spontaneous labor, progressing normally  Labor: pitocin aug just started,  Preeclampsia:  no signs or symptoms of toxicity Fetal Wellbeing:  Category I Pain Control:  Epidural Anticipated MOD:  NSVD  Recheck 1-2hr or PRN increased urge to push   Dr Su Hilt updated    Malissa Hippo 03/22/2013, 9:18 PM

## 2013-03-22 NOTE — MAU Note (Signed)
Patient states she is having contractions every 4-6 minutes with bloody show. States she had some leaking a 0655 but not sure if it has continued. Reports good fetal movement.

## 2013-03-22 NOTE — Progress Notes (Signed)
  Subjective: Pt is resting comfortably with an epidural.      Objective: BP 124/58  Pulse 92  Temp(Src) 98.5 F (36.9 C) (Oral)  Resp 16  Ht 5\' 5"  (1.651 m)  Wt 185 lb 3.2 oz (84.006 kg)  BMI 30.82 kg/m2  SpO2 99%  LMP 06/17/2012     FHT:  Cat I UC:   2-6 min SVE:   Dilation: 4 Effacement (%): 80 Station: -1 Exam by:: lee  Assessment / Plan: IUP at [redacted]w[redacted]d Active labor Epidural  CTO labor progress C/w Dr Normand Sloop PRN  Haroldine Laws CNM, MSN 03/22/2013, 3:19 PM

## 2013-03-22 NOTE — H&P (Signed)
Tasha Collins is a 27 y.o. female, G2P0010 at 39 weeks, presenting for spotting, UCs, and LOF.  Denies HA, visual changes, or right epigastric pain.  Patient Active Problem List  Diagnosis  . Vaginal yeast infection  . Penicillin allergy  . Pregnancy    History of present pregnancy: Patient entered care at [redacted]w[redacted]d.   EDC of 03/24/13 was established by LMP.   Anatomy scan:  19 weeks, with normal findings except for bilateral pyelectasis and an posterior placenta.   Additional [place] evaluations:  [redacted]w[redacted]d for viability; [redacted]w[redacted]d to f/u pyelectasis - still seen; [redacted]w[redacted]d for growth and to f/u pyelectasis - normal linear growth and resolved pyelectasis.   Significant prenatal events:  Abn 1hr GTT.  3 hr GTT WNL   Last evaluation:  03/20/13 @   OB History   Grav Para Term Preterm Abortions TAB SAB Ect Mult Living   2    1  1         Past Medical History  Diagnosis Date  . H/O nausea 2011  . BV (bacterial vaginosis) 2011  . HPV in female   . Hx: UTI (urinary tract infection)   . Candida vaginitis 03/15/12  . Headache     in the past  . Anemia 2005;2008    needed iron supplements  . Abnormal Pap smear 04/2009    Colpo;was normal;Last pap 04/2011   Past Surgical History  Procedure Laterality Date  . Wisdom tooth extraction  2004    All 4 are removed   Family History: family history includes Alzheimer's disease in her paternal grandmother; Aneurysm in her maternal grandmother; Cancer in her maternal uncles, mother, and other; Diabetes in her father, maternal grandfather, maternal uncle, and paternal uncle; Heart disease in her maternal grandfather and maternal uncle; Hypertension in her maternal grandfather, maternal grandmother, maternal uncle, paternal grandfather, paternal grandmother, and paternal uncle; Kidney failure in her mother; Multiple sclerosis in her cousin; Nephrolithiasis in her maternal uncle; Rheum arthritis in her paternal uncle; and Seizures in her maternal uncle. Social History:   reports that she has never smoked. She has never used smokeless tobacco. She reports that she does not drink alcohol or use illicit drugs.   Prenatal Transfer Tool  Maternal Diabetes: No Genetic Screening: Declined Maternal Ultrasounds/Referrals: Abnormal:  Findings:   Other: Pyelectasis - resolved on 29 week U/S Fetal Ultrasounds or other Referrals:  None Maternal Substance Abuse:  No Significant Maternal Medications:  None Significant Maternal Lab Results: None    ROS:  See HPI above  Allergies  Allergen Reactions  . Penicillins     Childhood allergy.  Reaction unkinown     Dilation: 3 Effacement (%): 50 Station: -2 Exam by:: J Oxely CNM Blood pressure 129/81, pulse 93, temperature 97.9 F (36.6 C), temperature source Oral, resp. rate 18, height 5\' 5"  (1.651 m), weight 185 lb 3.2 oz (84.006 kg), last menstrual period 06/17/2012.  Chest clear Heart RRR without murmur Abd gravid, NT, FH 39 Pelvic: see above            Bloody show noted;  sm amount of vernix noted on glove             Ext: WNL  FHR: Cat I UCs:  3-5 min  Results for orders placed during the hospital encounter of 03/22/13 (from the past 24 hour(s))  AMNISURE RUPTURE OF MEMBRANE (ROM)     Status: None   Collection Time    03/22/13  9:27 AM  Result Value Range   Amnisure ROM NEGATIVE    CBC     Status: None   Collection Time    03/22/13 10:00 AM      Result Value Range   WBC 7.3  4.0 - 10.5 K/uL   RBC 4.50  3.87 - 5.11 MIL/uL   Hemoglobin 13.6  12.0 - 15.0 g/dL   HCT 16.1  09.6 - 04.5 %   MCV 87.8  78.0 - 100.0 fL   MCH 30.2  26.0 - 34.0 pg   MCHC 34.4  30.0 - 36.0 g/dL   RDW 40.9  81.1 - 91.4 %   Platelets 157  150 - 400 K/uL   Prenatal labs: ABO, Rh: O/POS/-- (08/08 1430) Antibody: NEG (08/08 1430) Rubella:   Immune RPR: NON REAC (01/07 1012)  HBsAg: NEGATIVE (08/08 1430)  HIV: NON REACTIVE (08/08 1430)  GBS: Negative (03/06 0000) Sickle cell/Hgb electrophoresis:  Normal  Study Pap:  08/24/12 WNL GC:  Neg Chlamydia:  Neg Genetic screenings:  Declined Glucola:  1 hr = 143;  3hr = 76, 161, 151, 137   Assessment/Plan: IUP at [redacted]w[redacted]d Active labor with ROM Penicillin Allergy GBS Neg  Plan: C/w Dr. Normand Sloop Admit to L&D Epidural PRN CTO labor progress   Rowan Blase, MSN 03/22/2013, 9:31 AM

## 2013-03-22 NOTE — MAU Note (Signed)
Name and DOB verified, pt confirmed spelling is correct.

## 2013-03-22 NOTE — MAU Note (Signed)
Pains woke her a little after 4, small gush right before 7. Was almost 3 on Tues when checked. No problems with preg

## 2013-03-22 NOTE — Anesthesia Procedure Notes (Signed)

## 2013-03-22 NOTE — Anesthesia Preprocedure Evaluation (Signed)
Anesthesia Evaluation  Patient identified by MRN, date of birth, ID band Patient awake    Reviewed: Allergy & Precautions, H&P , Patient's Chart, lab work & pertinent test results  Airway Mallampati: II TM Distance: >3 FB Neck ROM: full    Dental no notable dental hx.    Pulmonary neg pulmonary ROS,  breath sounds clear to auscultation  Pulmonary exam normal       Cardiovascular negative cardio ROS  Rhythm:regular Rate:Normal     Neuro/Psych  Headaches, negative neurological ROS  negative psych ROS   GI/Hepatic negative GI ROS, Neg liver ROS,   Endo/Other  negative endocrine ROS  Renal/GU negative Renal ROS     Musculoskeletal   Abdominal   Peds  Hematology negative hematology ROS (+) anemia ,   Anesthesia Other Findings H/O nausea 2011   BV (bacterial vaginosis) 2011      HPV in female     Hx: UTI (urinary tract infection)        Candida vaginitis 03/15/12   Headache   in the past    Anemia 2005;2008 needed iron supplements Abnormal Pap smear 04/2009 Colpo;was normal;Last pap 04/2011    Reproductive/Obstetrics (+) Pregnancy                           Anesthesia Physical Anesthesia Plan  ASA: II  Anesthesia Plan: Epidural   Post-op Pain Management:    Induction:   Airway Management Planned:   Additional Equipment:   Intra-op Plan:   Post-operative Plan:   Informed Consent: I have reviewed the patients History and Physical, chart, labs and discussed the procedure including the risks, benefits and alternatives for the proposed anesthesia with the patient or authorized representative who has indicated his/her understanding and acceptance.     Plan Discussed with:   Anesthesia Plan Comments:         Anesthesia Quick Evaluation

## 2013-03-23 ENCOUNTER — Encounter (HOSPITAL_COMMUNITY): Payer: Self-pay | Admitting: *Deleted

## 2013-03-23 LAB — CBC
MCH: 29.5 pg (ref 26.0–34.0)
MCHC: 33.4 g/dL (ref 30.0–36.0)
Platelets: 154 10*3/uL (ref 150–400)
RBC: 4.07 MIL/uL (ref 3.87–5.11)

## 2013-03-23 MED ORDER — SENNOSIDES-DOCUSATE SODIUM 8.6-50 MG PO TABS
2.0000 | ORAL_TABLET | Freq: Every day | ORAL | Status: DC
Start: 2013-03-23 — End: 2013-03-24
  Administered 2013-03-23: 2 via ORAL

## 2013-03-23 MED ORDER — DIBUCAINE 1 % RE OINT: 1.0000 "application " | TOPICAL_OINTMENT | RECTAL | Status: DC | PRN

## 2013-03-23 MED ORDER — ZOLPIDEM TARTRATE 5 MG PO TABS: 5.0000 mg | ORAL_TABLET | Freq: Every evening | ORAL | Status: DC | PRN

## 2013-03-23 MED ORDER — ONDANSETRON HCL 4 MG PO TABS: 4.0000 mg | ORAL_TABLET | ORAL | Status: DC | PRN

## 2013-03-23 MED ORDER — BISACODYL 10 MG RE SUPP: 10.0000 mg | Freq: Every day | RECTAL | Status: DC | PRN

## 2013-03-23 MED ORDER — PRENATAL MULTIVITAMIN CH
1.0000 | ORAL_TABLET | Freq: Every day | ORAL | Status: DC
  Administered 2013-03-23: 1 via ORAL
  Filled 2013-03-23: qty 1

## 2013-03-23 MED ORDER — ONDANSETRON HCL 4 MG/2ML IJ SOLN: 4.0000 mg | INTRAMUSCULAR | Status: DC | PRN

## 2013-03-23 MED ORDER — LANOLIN HYDROUS EX OINT: TOPICAL_OINTMENT | CUTANEOUS | Status: DC | PRN

## 2013-03-23 MED ORDER — WITCH HAZEL-GLYCERIN EX PADS
1.0000 "application " | MEDICATED_PAD | CUTANEOUS | Status: DC | PRN
  Administered 2013-03-23: 1 via TOPICAL

## 2013-03-23 MED ORDER — FLEET ENEMA 7-19 GM/118ML RE ENEM: 1.0000 | ENEMA | Freq: Every day | RECTAL | Status: DC | PRN

## 2013-03-23 MED ORDER — BENZOCAINE-MENTHOL 20-0.5 % EX AERO
1.0000 "application " | INHALATION_SPRAY | CUTANEOUS | Status: DC | PRN
  Administered 2013-03-23: 1 via TOPICAL
  Filled 2013-03-23: qty 56

## 2013-03-23 MED ORDER — TETANUS-DIPHTH-ACELL PERTUSSIS 5-2.5-18.5 LF-MCG/0.5 IM SUSP
0.5000 mL | Freq: Once | INTRAMUSCULAR | Status: AC
  Administered 2013-03-24: 0.5 mL via INTRAMUSCULAR
  Filled 2013-03-23: qty 0.5

## 2013-03-23 MED ORDER — SIMETHICONE 80 MG PO CHEW: 80.0000 mg | CHEWABLE_TABLET | ORAL | Status: DC | PRN

## 2013-03-23 MED ORDER — DIPHENHYDRAMINE HCL 25 MG PO CAPS: 25.0000 mg | ORAL_CAPSULE | Freq: Four times a day (QID) | ORAL | Status: DC | PRN

## 2013-03-23 MED ORDER — IBUPROFEN 600 MG PO TABS
600.0000 mg | ORAL_TABLET | Freq: Four times a day (QID) | ORAL | Status: DC
  Administered 2013-03-23 – 2013-03-24 (×6): 600 mg via ORAL
  Filled 2013-03-23 (×6): qty 1

## 2013-03-23 MED ORDER — MEDROXYPROGESTERONE ACETATE 150 MG/ML IM SUSP: 150.0000 mg | INTRAMUSCULAR | Status: DC | PRN

## 2013-03-23 MED ORDER — MEASLES, MUMPS & RUBELLA VAC ~~LOC~~ INJ
0.5000 mL | INJECTION | Freq: Once | SUBCUTANEOUS | Status: DC
  Filled 2013-03-23: qty 0.5

## 2013-03-23 MED ORDER — OXYCODONE-ACETAMINOPHEN 5-325 MG PO TABS: 1.0000 | ORAL_TABLET | ORAL | Status: DC | PRN

## 2013-03-23 NOTE — Anesthesia Postprocedure Evaluation (Signed)
Anesthesia Post Note  Patient: Tasha Collins  Procedure(s) Performed: * No procedures listed *  Anesthesia type: Epidural  Patient location: Mother/Baby  Post pain: Pain level controlled  Post assessment: Post-op Vital signs reviewed  Last Vitals:  Filed Vitals:   03/23/13 0705  BP: 108/75  Pulse: 89  Temp: 37.1 C  Resp: 18    Post vital signs: Reviewed  Level of consciousness: awake  Complications: No apparent anesthesia complications

## 2013-03-23 NOTE — Lactation Note (Signed)
This note was copied from the chart of Tasha [employer]. Lactation Consultation Note Initial consult for this first time mom; mom holding baby in football on the left when I enter room, baby is latched on well with deep latch and rhythmic sucking, mom c/o pain. Mom's nipple is compressed, mom states latch hurts, attempted to get baby latched deeper. Baby's latch was then very deep with lips flanged, mom states it still hurts. Enc mom to always make sure to use good pillow support and good positioning, and sandwich the breast to get deeper latch.  Mom states baby does not latch well to the right side, attempted to latch baby in cross cradle on the right, but baby sound asleep by then.  Discussed hand expression and br feeding basics with mom and FOB. FOB at bedside and very supportive. Questions answered. Lactation brochure provided, mom and dad made aware of lactation services and [facility].  Patient Name: Tasha Collins Today's Date: 03/23/2013 Reason for consult: Initial assessment   Maternal Data Formula Feeding for Exclusion: No Does the patient have breastfeeding experience prior to this delivery?: No  Feeding Feeding Type: Breast Milk Feeding method: Breast Length of feed: 30 min  LATCH Score/Interventions Latch: Grasps breast easily, tongue down, lips flanged, rhythmical sucking. Intervention(s): Adjust position;Assist with latch;Breast massage  Audible Swallowing: A few with stimulation Intervention(s): Hand expression  Type of Nipple: Everted at rest and after stimulation  Comfort (Breast/Nipple): Filling, red/small blisters or bruises, mild/mod discomfort  Problem noted: Mild/Moderate discomfort Interventions (Mild/moderate discomfort): Hand expression;Hand massage  Hold (Positioning): Assistance needed to correctly position infant at breast and maintain latch. Intervention(s): Breastfeeding basics reviewed;Support Pillows;Position options;Skin to skin  LATCH Score:  7  Lactation Tools Discussed/Used     Consult Status Consult Status: Follow-up Follow-up type: In-patient    Octavio Manns [facility] 03/23/2013, 2:51 PM

## 2013-03-23 NOTE — Progress Notes (Signed)
Post Partum Day 1:S/P SVB, partial 3rd degree Subjective: Patient up ad lib, denies syncope or dizziness. Feeding:  Breast Contraceptive plan:   Undecided at present  Objective: Blood pressure 108/75, pulse 89, temperature 98.8 F (37.1 C), temperature source Oral, resp. rate 18, height 5\' 5"  (1.651 m), weight 185 lb 3.2 oz (84.006 kg), last menstrual period 06/17/2012, SpO2 99.00%, unknown if currently breastfeeding.  Physical Exam:  General: alert Lochia: appropriate Uterine Fundus: firm Incision: healing well DVT Evaluation: No evidence of DVT seen on physical exam. Negative Homan's sign.   Recent Labs  03/22/13 1000 03/23/13 0715  HGB 13.6 12.0  HCT 39.5 35.9*    Assessment/Plan: S/P Vaginal delivery day 1 Continue current care Plan for discharge tomorrow Perineal care reviewed. Contraception options reviewed--patient will consider and decide by 6 weeks pp.   LOS: 1 day   Tasha Collins 03/23/2013, 11:35 AM

## 2013-03-24 MED ORDER — IBUPROFEN 600 MG PO TABS: 600.0000 mg | ORAL_TABLET | Freq: Four times a day (QID) | ORAL | Status: DC

## 2013-03-24 MED ORDER — OXYCODONE-ACETAMINOPHEN 5-325 MG PO TABS: 1.0000 | ORAL_TABLET | ORAL | Status: DC | PRN

## 2013-03-24 NOTE — Discharge Summary (Signed)
Obstetric Discharge Summary  Reason for Admission: onset of labor 03/22/13 Prenatal Procedures: none Intrapartum Procedures: spontaneous vaginal delivery by Sanda Klein CNM 03/23/13 Postpartum Procedures: none Complications-Operative and Postpartum: none  Hemoglobin  Date Value Range Status  03/23/2013 12.0  12.0 - 15.0 g/dL Final     HCT  Date Value Range Status  03/23/2013 35.9* 36.0 - 46.0 % Final    Discharge Diagnoses: Term Pregnancy-delivered  Discharge Information:  Date: 03/24/2013 Activity: unrestricted Diet: routine Medications: Ibuprofen, Colace and Percocet Condition: stable  Breastfeeding: yes  Instructions: refer to practice specific booklet Discharge to: home   Newborn Data: girl Information for the patient's newborn:  Aaryana, Betke [[id number]]  female  Home with mother.  Jnyah Brazee A MD 03/24/2013, 10:25 AM

## 2014-10-21 ENCOUNTER — Encounter (HOSPITAL_COMMUNITY): Payer: Self-pay | Admitting: *Deleted

## 2015-09-28 ENCOUNTER — Emergency Department (HOSPITAL_BASED_OUTPATIENT_CLINIC_OR_DEPARTMENT_OTHER): Payer: BLUE CROSS/BLUE SHIELD

## 2015-09-28 ENCOUNTER — Encounter (HOSPITAL_BASED_OUTPATIENT_CLINIC_OR_DEPARTMENT_OTHER): Payer: Self-pay | Admitting: Emergency Medicine

## 2015-09-28 ENCOUNTER — Emergency Department (HOSPITAL_BASED_OUTPATIENT_CLINIC_OR_DEPARTMENT_OTHER)
Admission: EM | Admit: 2015-09-28 | Discharge: 2015-09-28 | Disposition: A | Discharge status: Home / Self Care | Payer: BLUE CROSS/BLUE SHIELD | Attending: Emergency Medicine | Admitting: Emergency Medicine

## 2015-09-28 DIAGNOSIS — Z8742 Personal history of other diseases of the female genital tract: Secondary | ICD-10-CM | POA: Diagnosis not present

## 2015-09-28 DIAGNOSIS — D649 Anemia, unspecified: Secondary | ICD-10-CM | POA: Diagnosis not present

## 2015-09-28 DIAGNOSIS — Z79899 Other long term (current) drug therapy: Secondary | ICD-10-CM | POA: Insufficient documentation

## 2015-09-28 DIAGNOSIS — R0789 Other chest pain: Secondary | ICD-10-CM | POA: Diagnosis not present

## 2015-09-28 DIAGNOSIS — Z880 Allergy status to penicillin: Secondary | ICD-10-CM | POA: Insufficient documentation

## 2015-09-28 DIAGNOSIS — R0602 Shortness of breath: Secondary | ICD-10-CM | POA: Diagnosis not present

## 2015-09-28 DIAGNOSIS — Z3202 Encounter for pregnancy test, result negative: Secondary | ICD-10-CM | POA: Diagnosis not present

## 2015-09-28 DIAGNOSIS — Z8619 Personal history of other infectious and parasitic diseases: Secondary | ICD-10-CM | POA: Diagnosis not present

## 2015-09-28 DIAGNOSIS — R079 Chest pain, unspecified: Secondary | ICD-10-CM | POA: Diagnosis present

## 2015-09-28 DIAGNOSIS — Z87440 Personal history of urinary (tract) infections: Secondary | ICD-10-CM | POA: Insufficient documentation

## 2015-09-28 LAB — PREGNANCY, URINE: Preg Test, Ur: NEGATIVE

## 2015-09-28 MED ORDER — IBUPROFEN 600 MG PO TABS: 600.0000 mg | ORAL_TABLET | Freq: Four times a day (QID) | ORAL | Status: DC | PRN

## 2015-09-28 NOTE — Discharge Instructions (Signed)

## 2015-09-28 NOTE — ED Notes (Signed)
Presents with left ant chest pain, tenderness, states has some SOB with episode, denies cough

## 2015-09-28 NOTE — ED Notes (Signed)
Pt awoke from sleep this morning with a sharp left side chest pain and feeling like she could not catch her breath.  Pt states episode lasted about 30 minutes before it started to subside.

## 2015-09-28 NOTE — ED Notes (Signed)
Patient transported to X-ray 

## 2015-09-28 NOTE — ED Provider Notes (Signed)
CSN: [id number]     Arrival date & time 09/28/15  0802 History   First MD Initiated Contact with Patient 09/28/15 [phone]     Chief Complaint  Patient presents with  . Chest Pain     (Consider location/radiation/quality/duration/timing/severity/associated sxs/prior Treatment) Patient is a 29 y.o. female presenting with chest pain. The history is provided by the patient.  Chest Pain Pain location:  L lateral chest Pain quality: sharp   Pain radiates to:  Does not radiate Pain radiates to the back: no   Pain severity:  Mild Onset quality:  Sudden Duration:  2 hours Timing:  Intermittent Progression:  Waxing and waning Chronicity:  New Context: movement and at rest   Relieved by:  Nothing Worsened by:  Nothing tried Ineffective treatments:  None tried Associated symptoms: shortness of breath (with walking and movement)   Risk factors: no birth control, no immobilization, not pregnant and no prior DVT/PE     Past Medical History  Diagnosis Date  . H/O nausea 2011  . BV (bacterial vaginosis) 2011  . HPV in female   . Hx: UTI (urinary tract infection)   . Candida vaginitis 03/15/12  . Headache(784.0)     in the past  . Anemia 2005;2008    needed iron supplements  . Abnormal Pap smear 04/2009    Colpo;was normal;Last pap 04/2011   Past Surgical History  Procedure Laterality Date  . Wisdom tooth extraction  2004    All 4 are removed   Family History  Problem Relation Age of Onset  . Hypertension Maternal Grandmother   . Hypertension Maternal Grandfather   . Hypertension Paternal Grandmother   . Hypertension Paternal Grandfather   . Hypertension Maternal Uncle   . Hypertension Paternal Uncle   . Diabetes Father   . Diabetes Maternal Grandfather   . Diabetes Maternal Uncle   . Diabetes Paternal Uncle   . Heart disease Maternal Grandfather   . Heart disease Maternal Uncle   . Alzheimer's disease Paternal Grandmother   . Seizures Maternal Uncle   . Aneurysm Maternal  Grandmother   . Cancer Maternal Uncle     Eye  . Kidney failure Mother     d/t Hoover Brunette  . Cancer Mother     Leukemia  . Nephrolithiasis Maternal Uncle   . Cancer Maternal Uncle     brain  . Cancer Maternal Uncle     lung  . Rheum arthritis Paternal Uncle   . Multiple sclerosis Cousin     maternal  . Cancer Other     FOB father :walden strum's   Social History  Substance Use Topics  . Smoking status: Never Smoker   . Smokeless tobacco: Never Used  . Alcohol Use: No   OB History    Gravida Para Term Preterm AB TAB SAB Ectopic Multiple Living   Review of Systems  Respiratory: Positive for shortness of breath (with walking and movement).   Cardiovascular: Positive for chest pain.  All other systems reviewed and are negative.     Allergies  Penicillins  Home Medications   Prior to Admission medications   Medication Sig Start Date End Date Taking? Authorizing Provider  Docosahexaenoic Acid (DHA OMEGA 3 PO) Take 2 tablets by mouth daily.    Historical Provider, MD  ibuprofen (ADVIL,MOTRIN) 600 MG tablet Take 1 tablet (600 mg total) by mouth every 6 (six) hours. 03/24/13   Dois Davenport  Rivard, MD  oxyCODONE-acetaminophen (PERCOCET/ROXICET) 5-325 MG per tablet Take 1-2 tablets by mouth every 4 (four) hours as needed. 03/24/13   Silverio Lay, MD  Prenatal Vit-Fe Sulfate-FA (PRENATAL VITAMIN PO) Take 1 tablet by mouth daily.     Historical Provider, MD   BP 123/78 mmHg  Pulse 90  Temp(Src) 98.9 F (37.2 C) (Oral)  Resp 18  Ht  (1.626 m)  Wt 142 lb (64.411 kg)  BMI 24.36 kg/m2  SpO2 100%  LMP 09/22/2015 (Exact Date)  Breastfeeding? No Physical Exam  Constitutional: She is oriented to person, place, and time. She appears well-developed and well-nourished. No distress.  HENT:  Head: Normocephalic.  Eyes: Conjunctivae are normal.  Neck: Neck supple. No tracheal deviation present.  Cardiovascular: Normal rate and regular rhythm.   Pulmonary/Chest:  Effort normal. No respiratory distress. She exhibits tenderness (over left lateral chest).  Abdominal: Soft. She exhibits no distension.  Neurological: She is alert and oriented to person, place, and time.  Skin: Skin is warm and dry.  Psychiatric: She has a normal mood and affect.    ED Course  Procedures (including critical care time) Labs Review Labs Reviewed  PREGNANCY, URINE    Imaging Review No results found. I have personally reviewed and evaluated these images and lab results as part of my medical decision-making.   EKG Interpretation   Date/Time:  Sunday September 28 2015 08:15:07 EDT Ventricular Rate:  82 PR Interval:  138 QRS Duration: 82 QT Interval:  358 QTC Calculation: 418 R Axis:   81 Text Interpretation:  Normal sinus rhythm Normal ECG Confirmed by Antonie Borjon  MD, Sinclair Arrazola ([id number]) on 09/28/2015 8:14:46 AM      MDM   Final diagnoses:  Left-sided chest wall pain    29 year old female presents with sharp left sided atypical chest pain. Started today, appears anxious regarding this symptom. Tender to palpation over the left chest. Low risk for ACS and PERC negative. Otherwise well-appearing. Screening EKG and x-ray are unremarkable. NSAIDs for likely musculoskeletal pain and primary care follow-up as needed.    Lyndal Pulley, MD 09/28/15 [phone]

## 2015-12-21 NOTE — L&D Delivery Note (Signed)
Delivery Note At 3:38 AM a viable female, "Tasha Collins", was delivered via Vaginal, Spontaneous Delivery (Presentation: ROA).  APGAR: 8, 9; weight  .   Placenta status: Spontaneous, intact.   Cord: WNL: .  Cord pH: NA  NICU team at delivery due to prematurity.  Anesthesia:  Epidural  Episiotomy: None Lacerations: 1st degree;Perineal Suture Repair: 3.0 vicryl Est. Blood Loss (mL): 200  Mom to postpartum.  Baby to Couplet care / Skin to Skin. Family plans outpatient circumcision at peds office. Patient undecided regarding contraception.  Tasha Collins, Tasha Collins 08/21/2016, 4:19 AM

## 2016-01-28 LAB — OB RESULTS CONSOLE RUBELLA ANTIBODY, IGM: Rubella: IMMUNE

## 2016-01-28 LAB — OB RESULTS CONSOLE HIV ANTIBODY (ROUTINE TESTING): HIV: NONREACTIVE

## 2016-01-28 LAB — OB RESULTS CONSOLE GC/CHLAMYDIA
CHLAMYDIA, DNA PROBE: NEGATIVE
GC PROBE AMP, GENITAL: NEGATIVE

## 2016-01-28 LAB — OB RESULTS CONSOLE RPR: RPR: NONREACTIVE

## 2016-01-28 LAB — OB RESULTS CONSOLE HEPATITIS B SURFACE ANTIGEN: Hepatitis B Surface Ag: NEGATIVE

## 2016-03-16 ENCOUNTER — Inpatient Hospital Stay (HOSPITAL_COMMUNITY): Payer: BLUE CROSS/BLUE SHIELD

## 2016-03-16 ENCOUNTER — Inpatient Hospital Stay (HOSPITAL_COMMUNITY)
Admission: AD | Admit: 2016-03-16 | Discharge: 2016-03-16 | Disposition: A | Discharge status: Home / Self Care | Payer: BLUE CROSS/BLUE SHIELD | Source: Ambulatory Visit | Attending: Obstetrics & Gynecology | Admitting: Obstetrics & Gynecology

## 2016-03-16 ENCOUNTER — Encounter (HOSPITAL_COMMUNITY): Payer: Self-pay | Admitting: *Deleted

## 2016-03-16 DIAGNOSIS — Z3A12 12 weeks gestation of pregnancy: Secondary | ICD-10-CM | POA: Diagnosis not present

## 2016-03-16 DIAGNOSIS — Z79899 Other long term (current) drug therapy: Secondary | ICD-10-CM | POA: Diagnosis not present

## 2016-03-16 DIAGNOSIS — Z880 Allergy status to penicillin: Secondary | ICD-10-CM | POA: Insufficient documentation

## 2016-03-16 DIAGNOSIS — Z6740 Type O blood, Rh positive: Secondary | ICD-10-CM | POA: Diagnosis not present

## 2016-03-16 DIAGNOSIS — N939 Abnormal uterine and vaginal bleeding, unspecified: Secondary | ICD-10-CM

## 2016-03-16 DIAGNOSIS — O209 Hemorrhage in early pregnancy, unspecified: Secondary | ICD-10-CM | POA: Insufficient documentation

## 2016-03-16 LAB — URINALYSIS, ROUTINE W REFLEX MICROSCOPIC
BILIRUBIN URINE: NEGATIVE
GLUCOSE, UA: NEGATIVE mg/dL
KETONES UR: NEGATIVE mg/dL
LEUKOCYTES UA: NEGATIVE
Nitrite: NEGATIVE
PROTEIN: NEGATIVE mg/dL
Specific Gravity, Urine: <1.005 — ABNORMAL LOW (ref 1.005–1.030)
pH: 6.5 (ref 5.0–8.0)

## 2016-03-16 LAB — URINE MICROSCOPIC-ADD ON

## 2016-03-16 NOTE — MAU Note (Signed)
C/o heavy vaginal bleeding starting at 0635 this am; [redacted] weeks gestation;

## 2016-03-16 NOTE — MAU Provider Note (Signed)
History  Tasha Collins is a 30 yo G3P1011 @ 12.4 wks by sure LMP (EDD 09/24/16) who presents to MAU after calling with c/o heavy vaginal bleeding like a period that started this morning around 6:30 AM. Did not place pad. Also w/ ongoing cramping. Denies recent intercourse.  Blood type O+.  Patient Active Problem List   Diagnosis Date Noted  . Episode of heavy vaginal bleeding 03/16/2016  . Cramping affecting pregnancy, antepartum 03/16/2016  . Penicillin allergy 09/21/2012    Chief Complaint  Patient presents with  . Vaginal Bleeding   HPI  As above  OB History    Gravida Para Term Preterm AB TAB SAB Ectopic Multiple Living   3 1 1  1  1   1       Past Medical History  Diagnosis Date  . H/O nausea 2011  . BV (bacterial vaginosis) 2011  . HPV in female   . Hx: UTI (urinary tract infection)   . Candida vaginitis 03/15/12  . Headache(784.0)     in the past  . Anemia 2005;2008    needed iron supplements  . Abnormal Pap smear 04/2009    Colpo;was normal;Last pap 04/2011    Past Surgical History  Procedure Laterality Date  . Wisdom tooth extraction  2004    All 4 are removed    Family History  Problem Relation Age of Onset  . Hypertension Maternal Grandmother   . Hypertension Maternal Grandfather   . Hypertension Paternal Grandmother   . Hypertension Paternal Grandfather   . Hypertension Maternal Uncle   . Hypertension Paternal Uncle   . Diabetes Father   . Diabetes Maternal Grandfather   . Diabetes Maternal Uncle   . Diabetes Paternal Uncle   . Heart disease Maternal Grandfather   . Heart disease Maternal Uncle   . Alzheimer's disease Paternal Grandmother   . Seizures Maternal Uncle   . Aneurysm Maternal Grandmother   . Cancer Maternal Uncle     Eye  . Kidney failure Mother     d/t Hoover BrunetteLeukemiaq  . Cancer Mother     Leukemia  . Nephrolithiasis Maternal Uncle   . Cancer Maternal Uncle     brain  . Cancer Maternal Uncle     lung  . Rheum arthritis Paternal  Uncle   . Multiple sclerosis Cousin     maternal  . Cancer Other     FOB father :walden strum's    Social History  Substance Use Topics  . Smoking status: Never Smoker   . Smokeless tobacco: Never Used  . Alcohol Use: No    Allergies:  Allergies  Allergen Reactions  . Penicillins     Childhood allergy.  Reaction unknown Has patient had a PCN reaction causing immediate rash, facial/tongue/throat swelling, SOB or lightheadedness with hypotension: unknown Has patient had a PCN reaction causing severe rash involving mucus membranes or skin necrosis: unknown Has patient had a PCN reaction that required hospitalization No Has patient had a PCN reaction occurring within the last 10 years: No If all of the above answers are "NO", then may proceed with Cephalosporin use.     Prescriptions prior to admission  Medication Sig Dispense Refill Last Dose  . Docosahexaenoic Acid (DHA OMEGA 3 PO) Take 2 tablets by mouth daily.   03/15/2016 at Unknown time  . Prenatal Vit-Fe Sulfate-FA (PRENATAL VITAMIN PO) Take 1 tablet by mouth daily.    03/15/2016 at Unknown time  . ibuprofen (ADVIL,MOTRIN) 600 MG tablet Take  1 tablet (600 mg total) by mouth every 6 (six) hours as needed for mild pain. (Patient not taking: Reported on 03/16/2016) 30 tablet 0 Completed Course at Unknown time  . [DISCONTINUED] oxyCODONE-acetaminophen (PERCOCET/ROXICET) 5-325 MG per tablet Take 1-2 tablets by mouth every 4 (four) hours as needed. (Patient not taking: Reported on 03/16/2016) 30 tablet 0 Completed Course at Unknown time    ROS  Vaginal bleeding Cramping Physical Exam   LMP: 12/19/15   Physical Exam  Gen: NAD. Lungs: CTAB. CV: RRR w/o M/R/G. Abdomen: gravid, soft, NT, no rebound or guarding. External genitalia: No external erythema, edema or excoriation. Vagina: No lesions. Speculum: No blood in vault. No bleeding from cervix. No odor. Cvx notable for ? nabothian cysts/ectropion.  Ext: WNL. Doptones: 158  bpm.   ED Course  U/S  Assessment: Vaginal bleeding of unclear etiology. +Doptones. Rh positive. PCN allergy.  Plan: U/S.  Care turned over to Nigel Bridgeman, CNM.   Sherre Scarlet CNM, MS 03/16/2016 9:22 AM   Addendum: Returned from [place]:  SIUP, 13 1/7 weeks, EDC 09/20/16, c/w dates.  Small amount bleeding, no pain.  Home on pelvic rest. OOW x 2 days. Will schedule for f/u visit in 2 weeks, to call prn with any questions.  Nigel Bridgeman, CNM 03/16/16 10a

## 2016-03-16 NOTE — Discharge Instructions (Signed)
PELVIC REST UNTIL EVALUATION IN OFFICE IN 2 WEEKS. CALL FOR ANY QUESTIONS OR CONCERNS.  Vaginal Bleeding During Pregnancy, First Trimester A small amount of bleeding (spotting) from the vagina is relatively common in early pregnancy. It usually stops on its own. Various things may cause bleeding or spotting in early pregnancy. Some bleeding may be related to the pregnancy, and some may not. In most cases, the bleeding is normal and is not a problem. However, bleeding can also be a sign of something serious. Be sure to tell your health care provider about any vaginal bleeding right away. Some possible causes of vaginal bleeding during the first trimester include:  Infection or inflammation of the cervix.  Growths (polyps) on the cervix.  Miscarriage or threatened miscarriage.  Pregnancy tissue has developed outside of the uterus and in a fallopian tube (tubal pregnancy).  Tiny cysts have developed in the uterus instead of pregnancy tissue (molar pregnancy). HOME CARE INSTRUCTIONS  Watch your condition for any changes. The following actions may help to lessen any discomfort you are feeling:  Follow your health care provider's instructions for limiting your activity. If your health care provider orders bed rest, you may need to stay in bed and only get up to use the bathroom. However, your health care provider may allow you to continue light activity.  If needed, make plans for someone to help with your regular activities and responsibilities while you are on bed rest.  Keep track of the number of pads you use each day, how often you change pads, and how soaked (saturated) they are. Write this down.  Do not use tampons. Do not douche.  Do not have sexual intercourse or orgasms until approved by your health care provider.  If you pass any tissue from your vagina, save the tissue so you can show it to your health care provider.  Only take over-the-counter or prescription medicines as  directed by your health care provider.  Do not take aspirin because it can make you bleed.  Keep all follow-up appointments as directed by your health care provider. SEEK MEDICAL CARE IF:  You have any vaginal bleeding during any part of your pregnancy.  You have cramps or labor pains.  You have a fever, not controlled by medicine. SEEK IMMEDIATE MEDICAL CARE IF:   You have severe cramps in your back or belly (abdomen).  You pass large clots or tissue from your vagina.  Your bleeding increases.  You feel light-headed or weak, or you have fainting episodes.  You have chills.  You are leaking fluid or have a gush of fluid from your vagina.  You pass out while having a bowel movement. MAKE SURE YOU:  Understand these instructions.  Will watch your condition.  Will get help right away if you are not doing well or get worse.   This information is not intended to replace advice given to you by your health care provider. Make sure you discuss any questions you have with your health care provider.   Document Released: 09/15/2005 Document Revised: 12/11/2013 Document Reviewed: 08/13/2013 Elsevier Interactive Patient Education [employer]2016 Elsevier Inc.

## 2016-06-21 ENCOUNTER — Inpatient Hospital Stay (HOSPITAL_COMMUNITY)
Admission: AD | Admit: 2016-06-21 | Discharge: 2016-06-21 | Disposition: A | Discharge status: Home / Self Care | Payer: BLUE CROSS/BLUE SHIELD | Source: Ambulatory Visit | Attending: Obstetrics and Gynecology | Admitting: Obstetrics and Gynecology

## 2016-06-21 ENCOUNTER — Encounter (HOSPITAL_COMMUNITY): Payer: Self-pay | Admitting: *Deleted

## 2016-06-21 DIAGNOSIS — R42 Dizziness and giddiness: Secondary | ICD-10-CM

## 2016-06-21 DIAGNOSIS — R002 Palpitations: Secondary | ICD-10-CM | POA: Diagnosis not present

## 2016-06-21 DIAGNOSIS — O210 Mild hyperemesis gravidarum: Secondary | ICD-10-CM | POA: Insufficient documentation

## 2016-06-21 DIAGNOSIS — O219 Vomiting of pregnancy, unspecified: Secondary | ICD-10-CM

## 2016-06-21 DIAGNOSIS — Z3A26 26 weeks gestation of pregnancy: Secondary | ICD-10-CM | POA: Diagnosis not present

## 2016-06-21 DIAGNOSIS — Z880 Allergy status to penicillin: Secondary | ICD-10-CM | POA: Diagnosis not present

## 2016-06-21 LAB — CBC
HCT: 31.9 % — ABNORMAL LOW (ref 36.0–46.0)
Hemoglobin: 10.7 g/dL — ABNORMAL LOW (ref 12.0–15.0)
MCH: 28.5 pg (ref 26.0–34.0)
MCHC: 33.5 g/dL (ref 30.0–36.0)
MCV: 85.1 fL (ref 78.0–100.0)
PLATELETS: 227 10*3/uL (ref 150–400)
RBC: 3.75 MIL/uL — AB (ref 3.87–5.11)
RDW: 14.3 % (ref 11.5–15.5)
WBC: 6.4 10*3/uL (ref 4.0–10.5)

## 2016-06-21 LAB — URINALYSIS, ROUTINE W REFLEX MICROSCOPIC
Bilirubin Urine: NEGATIVE
Glucose, UA: 100 mg/dL — AB
HGB URINE DIPSTICK: NEGATIVE
Ketones, ur: NEGATIVE mg/dL
Nitrite: NEGATIVE
PH: 5.5 (ref 5.0–8.0)
Protein, ur: NEGATIVE mg/dL

## 2016-06-21 LAB — COMPREHENSIVE METABOLIC PANEL
ALBUMIN: 3 g/dL — AB (ref 3.5–5.0)
ALT: 13 U/L — ABNORMAL LOW (ref 14–54)
ANION GAP: 8 (ref 5–15)
AST: 16 U/L (ref 15–41)
Alkaline Phosphatase: 86 U/L (ref 38–126)
BUN: 7 mg/dL (ref 6–20)
CHLORIDE: 106 mmol/L (ref 101–111)
CO2: 20 mmol/L — AB (ref 22–32)
Calcium: 8.8 mg/dL — ABNORMAL LOW (ref 8.9–10.3)
Creatinine, Ser: 0.6 mg/dL (ref 0.44–1.00)
GFR calc non Af Amer: >60 mL/min (ref >60)
GLUCOSE: 125 mg/dL — AB (ref 65–99)
Potassium: 3.7 mmol/L (ref 3.5–5.1)
SODIUM: 134 mmol/L — AB (ref 135–145)
Total Bilirubin: 0.5 mg/dL (ref 0.3–1.2)
Total Protein: 6.7 g/dL (ref 6.5–8.1)

## 2016-06-21 LAB — URINE MICROSCOPIC-ADD ON: RBC / HPF: NONE SEEN RBC/hpf (ref 0–5)

## 2016-06-21 LAB — WET PREP, GENITAL
CLUE CELLS WET PREP: NONE SEEN
SPERM: NONE SEEN
TRICH WET PREP: NONE SEEN
Yeast Wet Prep HPF POC: NONE SEEN

## 2016-06-21 NOTE — MAU Provider Note (Signed)
Chief Complaint:  Dizziness; Shortness of Breath; Emesis During Pregnancy; and Diarrhea   First Provider Initiated Contact with Patient 06/21/16 1214      HPI: Tasha Collins is a 30 y.o. G3P1011 at 6326w3dwho presents to maternity admissions reporting onset of dizziness and shortness of breath 3 days ago associated with fast heartbeat, nausea and vomiting since onset. She reports that diarrhea started today with 2-3 episodes since this morning.  She reports the main symptom of concern is the dizziness which she feels both standing and at rest or lying down.  She has not tried any medications or treatments other than drinking more water which has not resolved her symptoms. Her symptoms are gradually worsening since onset.  She reports good fetal movement, denies abdominal pain, LOF, vaginal bleeding, vaginal itching/burning, urinary symptoms, h/a, dizziness, n/v, or fever/chills.    HPI  Past Medical History: Past Medical History  Diagnosis Date  . H/O nausea 2011  . BV (bacterial vaginosis) 2011  . HPV in female   . Hx: UTI (urinary tract infection)   . Candida vaginitis 03/15/12  . Headache(784.0)     in the past  . Anemia 2005;2008    needed iron supplements  . Abnormal Pap smear 04/2009    [place];was normal;Last pap 04/2011    Past obstetric history: OB History  Gravida Para Term Preterm AB SAB TAB Ectopic Multiple Living  3 1 1  1 1    1     # Outcome Date GA Lbr Len/2nd Weight Sex Delivery Anes PTL Lv  3 Current           2 Term 03/22/13 621w5d 13:50 / 02:08 6 lb 5 oz (2.863 kg) F Vag-Spont EPI  Y  1 SAB  [id number]       N      Past Surgical History: Past Surgical History  Procedure Laterality Date  . Wisdom tooth extraction  2004    All 4 are removed    Family History: Family History  Problem Relation Age of Onset  . Hypertension Maternal Grandmother   . Hypertension Maternal Grandfather   . Hypertension Paternal Grandmother   . Hypertension Paternal Grandfather   .  Hypertension Maternal Uncle   . Hypertension Paternal Uncle   . Diabetes Father   . Diabetes Maternal Grandfather   . Diabetes Maternal Uncle   . Diabetes Paternal Uncle   . Heart disease Maternal Grandfather   . Heart disease Maternal Uncle   . Alzheimer's disease Paternal Grandmother   . Seizures Maternal Uncle   . Aneurysm Maternal Grandmother   . Cancer Maternal Uncle     Eye  . Kidney failure Mother     d/t Hoover BrunetteLeukemiaq  . Cancer Mother     Leukemia  . Nephrolithiasis Maternal Uncle   . Cancer Maternal Uncle     brain  . Cancer Maternal Uncle     lung  . Rheum arthritis Paternal Uncle   . Multiple sclerosis Cousin     maternal  . Cancer Other     FOB father :walden strum's    Social History: Social History  Substance Use Topics  . Smoking status: Never Smoker   . Smokeless tobacco: Never Used  . Alcohol Use: No    Allergies:  Allergies  Allergen Reactions  . Penicillins     Childhood allergy.  Reaction unknown Has patient had a PCN reaction causing immediate rash, facial/tongue/throat swelling, SOB or lightheadedness with hypotension: unknown Has patient had a  PCN reaction causing severe rash involving mucus membranes or skin necrosis: unknown Has patient had a PCN reaction that required hospitalization No Has patient had a PCN reaction occurring within the last 10 years: No If all of the above answers are "NO", then may proceed with Cephalosporin use.     Meds:  No prescriptions prior to admission    ROS:  Review of Systems  Constitutional: Negative for fever, chills and fatigue.  Respiratory: Negative for shortness of breath.   Cardiovascular: Negative for chest pain and palpitations.       "feels like heartbeat is fast occasionally when I am dizzy"  Gastrointestinal: Positive for nausea, vomiting and diarrhea.  Genitourinary: Negative for dysuria, flank pain, vaginal bleeding, vaginal discharge, difficulty urinating, vaginal pain and pelvic pain.   Neurological: Positive for dizziness and light-headedness. Negative for syncope and headaches.  Psychiatric/Behavioral: Negative.      I have reviewed patient's Past Medical Hx, Surgical Hx, Family Hx, Social Hx, medications and allergies.   Physical Exam   Patient Vitals for the past 24 hrs:  BP Temp Temp src Pulse Resp SpO2 Height Weight  06/21/16 1442 (!) 100/53 mmHg - - 89 18 - - -  06/21/16 1301 112/69 mmHg - - 101 - - - -  06/21/16 1259 106/72 mmHg - - 107 - - - -  06/21/16 1258 108/66 mmHg - - 92 - - - -  06/21/16 1256 115/68 mmHg - - 93 16 - - -  06/21/16 1118 107/82 mmHg 98.5 F (36.9 C) Oral 106 18 99 % - -  06/21/16 1103 - - - - - - 5\' 4"  (1.626 m) 175 lb (79.379 kg)   Constitutional: Well-developed, well-nourished female in no acute distress.  Cardiovascular: normal rate Respiratory: normal effort GI: Abd soft, non-tender, gravid appropriate for gestational age.  MS: Extremities nontender, no edema, normal ROM Neurologic: Alert and oriented x 4.  GU: Neg CVAT.  PELVIC EXAM: Deferred     FHT:  Baseline 135 , moderate variability, accelerations present, no decelerations Contractions: None on toco or to palpation  EKG: normal EKG, normal sinus rhythm.   Labs: Results for orders placed or performed during the hospital encounter of 06/21/16 (from the past 24 hour(s))  Urinalysis, Routine w reflex microscopic (not [facility])     Status: Abnormal   Collection Time: 06/21/16 11:00 AM  Result Value Ref Range   Color, Urine YELLOW YELLOW   APPearance CLEAR CLEAR   Specific Gravity, Urine <1.005 (L) 1.005 - 1.030   pH 5.5 5.0 - 8.0   Glucose, UA 100 (A) NEGATIVE mg/dL   Hgb urine dipstick NEGATIVE NEGATIVE   Bilirubin Urine NEGATIVE NEGATIVE   Ketones, ur NEGATIVE NEGATIVE mg/dL   Protein, ur NEGATIVE NEGATIVE mg/dL   Nitrite NEGATIVE NEGATIVE   Leukocytes, UA MODERATE (A) NEGATIVE  Urine microscopic-add on     Status: Abnormal   Collection Time: 06/21/16 11:00  AM  Result Value Ref Range   Squamous Epithelial / LPF 6-30 (A) NONE SEEN   WBC, UA 6-30 0 - 5 WBC/hpf   RBC / HPF NONE SEEN 0 - 5 RBC/hpf   Bacteria, UA FEW (A) NONE SEEN  CBC     Status: Abnormal   Collection Time: 06/21/16 11:53 AM  Result Value Ref Range   WBC 6.4 4.0 - 10.5 K/uL   RBC 3.75 (L) 3.87 - 5.11 MIL/uL   Hemoglobin 10.7 (L) 12.0 - 15.0 g/dL   HCT 16.131.9 (L) 09.636.0 -  46.0 %   MCV 85.1 78.0 - 100.0 fL   MCH 28.5 26.0 - 34.0 pg   MCHC 33.5 30.0 - 36.0 g/dL   RDW 16.1 09.6 - 04.5 %   Platelets 227 150 - 400 K/uL  Comprehensive metabolic panel     Status: Abnormal   Collection Time: 06/21/16 11:53 AM  Result Value Ref Range   Sodium 134 (L) 135 - 145 mmol/L   Potassium 3.7 3.5 - 5.1 mmol/L   Chloride 106 101 - 111 mmol/L   CO2 20 (L) 22 - 32 mmol/L   Glucose, Bld 125 (H) 65 - 99 mg/dL   BUN 7 6 - 20 mg/dL   Creatinine, Ser 4.09 0.44 - 1.00 mg/dL   Calcium 8.8 (L) 8.9 - 10.3 mg/dL   Total Protein 6.7 6.5 - 8.1 g/dL   Albumin 3.0 (L) 3.5 - 5.0 g/dL   AST 16 15 - 41 U/L   ALT 13 (L) 14 - 54 U/L   Alkaline Phosphatase 86 38 - 126 U/L   Total Bilirubin 0.5 0.3 - 1.2 mg/dL   GFR calc non Af Amer >60 >60 mL/min   GFR calc Af Amer >60 >60 mL/min   Anion gap 8 5 - 15  Wet prep, genital     Status: Abnormal   Collection Time: 06/21/16 12:03 PM  Result Value Ref Range   Yeast Wet Prep HPF POC NONE SEEN NONE SEEN   Trich, Wet Prep NONE SEEN NONE SEEN   Clue Cells Wet Prep HPF POC NONE SEEN NONE SEEN   WBC, Wet Prep HPF POC MANY (A) NONE SEEN   Sperm NONE SEEN       Imaging:  No results found.  MAU Course/MDM: I have ordered labs and reviewed results.  NST reactive. Borderline anemia with hgb 10.7 at 26 weeks. No other abnormalities.  EKG wnl.  Consult Dr Normand Sloop. Pt to continue PNV and eat iron rich foods.  F/U in office on Wednesday if symptoms persist, return to MAU if symptoms worsen.  Pt stable at time of discharge.  Assessment: 1. Dizziness   2. Intermittent  palpitations   3. Nausea and vomiting during pregnancy     Plan: Discharge home Labor precautions and fetal kick counts     Follow-up Information    Follow up with [facility], MD.   Specialty:  Obstetrics and Gynecology   Why:  On Wednesday if symptoms persist, Return to MAU as needed for emergencies   Contact information:   [address] [place] [address] [phone]        Medication List    TAKE these medications        DHA OMEGA 3 PO  Take 1 tablet by mouth daily.     PRENATAL VITAMIN PO  Take 1 tablet by mouth daily.        Sharen Counter Certified Nurse-Midwife 06/21/2016 7:33 PM

## 2016-06-21 NOTE — Discharge Instructions (Signed)
Dizziness Dizziness is a common problem. It is a feeling of unsteadiness or light-headedness. You may feel like you are about to faint. Dizziness can lead to injury if you stumble or fall. Anyone can become dizzy, but dizziness is more common in older adults. This condition can be caused by a number of things, including medicines, dehydration, or illness. HOME CARE INSTRUCTIONS Taking these steps may help with your condition: Eating and Drinking  Drink enough fluid to keep your urine clear or pale yellow. This helps to keep you from becoming dehydrated. Try to drink more clear fluids, such as water.  Do not drink alcohol.  Limit your caffeine intake if directed by your health care provider.  Limit your salt intake if directed by your health care provider. Activity  Avoid making quick movements.  Rise slowly from chairs and steady yourself until you feel okay.  In the morning, first sit up on the side of the bed. When you feel okay, stand slowly while you hold onto something until you know that your balance is fine.  Move your legs often if you need to stand in one place for a long time. Tighten and relax your muscles in your legs while you are standing.  Do not drive or operate heavy machinery if you feel dizzy.  Avoid bending down if you feel dizzy. Place items in your home so that they are easy for you to reach without leaning over. Lifestyle  Do not use any tobacco products, including cigarettes, chewing tobacco, or electronic cigarettes. If you need help quitting, ask your health care provider.  Try to reduce your stress level, such as with yoga or meditation. Talk with your health care provider if you need help. General Instructions  Watch your dizziness for any changes.  Take medicines only as directed by your health care provider. Talk with your health care provider if you think that your dizziness is caused by a medicine that you are taking.  Tell a friend or a family  member that you are feeling dizzy. If he or she notices any changes in your behavior, have this person call your health care provider.  Keep all follow-up visits as directed by your health care provider. This is important. SEEK MEDICAL CARE IF:  Your dizziness does not go away.  Your dizziness or light-headedness gets worse.  You feel nauseous.  You have reduced hearing.  You have new symptoms.  You are unsteady on your feet or you feel like the room is spinning. SEEK IMMEDIATE MEDICAL CARE IF:  You vomit or have diarrhea and are unable to eat or drink anything.  You have problems talking, walking, swallowing, or using your arms, hands, or legs.  You feel generally weak.  You are not thinking clearly or you have trouble forming sentences. It may take a friend or family member to notice this.  You have chest pain, abdominal pain, shortness of breath, or sweating.  Your vision changes.  You notice any bleeding.  You have a headache.  You have neck pain or a stiff neck.  You have a fever.   This information is not intended to replace advice given to you by your health care provider. Make sure you discuss any questions you have with your health care provider.   Document Released: 06/01/2001 Document Revised: 04/22/2015 Document Reviewed: 12/02/2014 Elsevier Interactive Patient Education 2016 Elsevier Inc. Pregnancy and Anemia Anemia is a condition in which the concentration of red blood cells or hemoglobin in the  blood is below normal. Hemoglobin is a substance in red blood cells that carries oxygen to the tissues of the body. Anemia results in not enough oxygen reaching these tissues.  Anemia during pregnancy is common because the fetus uses more iron and folic acid as it is developing. Your body may not produce enough red blood cells because of this. Also, during pregnancy, the liquid part of the blood (plasma) increases by about 50%, and the red blood cells increase by  only 25%. This lowers the concentration of the red blood cells and creates a natural anemia-like situation.  CAUSES  The most common cause of anemia during pregnancy is not having enough iron in the body to make red blood cells (iron deficiency anemia). Other causes may include:  Folic acid deficiency.  Vitamin B12 deficiency.  Certain prescription or over-the-counter medicines.  Certain medical conditions or infections that destroy red blood cells.  A low platelet count and bleeding caused by antibodies that go through the placenta to the fetus from the mother's blood. SIGNS AND SYMPTOMS  Mild anemia may not be noticeable. If it becomes severe, symptoms may include:  Tiredness.  Shortness of breath, especially with exercise.  Weakness.  Fainting.  Pale looking skin.  Headaches.  Feeling a fast or irregular heartbeat (palpitations). DIAGNOSIS  The type of anemia is usually diagnosed from your family and medical history and blood tests. TREATMENT  Treatment of anemia during pregnancy depends on the cause of the anemia. Treatment can include:  Supplements of iron, vitamin B12, or folic acid.  A blood transfusion. This may be needed if blood loss is severe.  Hospitalization. This may be needed if there is significant continual blood loss.  Dietary changes. HOME CARE INSTRUCTIONS   Follow your dietitian's or health care provider's dietary recommendations.  Increase your vitamin C intake. This will help the stomach absorb more iron.  Eat a diet rich in iron. This would include foods such as:  Liver.  Beef.  Whole grain bread.  Eggs.  Dried fruit.  Take iron and vitamins as directed by your health care provider.  Eat green leafy vegetables. These are a good source of folic acid. SEEK MEDICAL CARE IF:   You have frequent or lasting headaches.  You are looking pale.  You are bruising easily. SEEK IMMEDIATE MEDICAL CARE IF:   You have extreme weakness,  shortness of breath, or chest pain.  You become dizzy or have trouble concentrating.  You have heavy vaginal bleeding.  You develop a rash.  You have bloody or black, tarry stools.  You faint.  You vomit up blood.  You vomit repeatedly.  You have abdominal pain.  You have a fever or persistent symptoms for more than 2-3 days.  You have a fever and your symptoms suddenly get worse.  You are dehydrated. MAKE SURE YOU:   Understand these instructions.  Will watch your condition.  Will get help right away if you are not doing well or get worse.   This information is not intended to replace advice given to you by your health care provider. Make sure you discuss any questions you have with your health care provider.   Document Released: 12/03/2000 Document Revised: 09/26/2013 Document Reviewed: 07/18/2013 Elsevier Interactive Patient Education [employer]

## 2016-06-21 NOTE — MAU Note (Signed)
Pt C/O dizziness, N&V, SOB since Friday.  Diarrhea started yesterday.  Feels like pulse fluctuates, is fast @ times.  Has lost balance a few times but has not fainted.  Denies uc's, bleeding or LOF.  Does have increased thin discharge.

## 2016-06-23 LAB — GC/CHLAMYDIA PROBE AMP (~~LOC~~) NOT AT ARMC
Chlamydia: NEGATIVE
Neisseria Gonorrhea: NEGATIVE

## 2016-08-20 ENCOUNTER — Inpatient Hospital Stay (HOSPITAL_COMMUNITY)
Admission: AD | Admit: 2016-08-20 | Discharge: 2016-08-22 | DRG: 775 | Disposition: A | Discharge status: Home / Self Care | Payer: BLUE CROSS/BLUE SHIELD | Source: Ambulatory Visit | Attending: Obstetrics and Gynecology | Admitting: Obstetrics and Gynecology

## 2016-08-20 ENCOUNTER — Inpatient Hospital Stay (HOSPITAL_COMMUNITY): Payer: BLUE CROSS/BLUE SHIELD

## 2016-08-20 ENCOUNTER — Inpatient Hospital Stay (HOSPITAL_COMMUNITY): Payer: BLUE CROSS/BLUE SHIELD | Admitting: Anesthesiology

## 2016-08-20 ENCOUNTER — Encounter (HOSPITAL_COMMUNITY): Payer: Self-pay

## 2016-08-20 DIAGNOSIS — E669 Obesity, unspecified: Secondary | ICD-10-CM | POA: Diagnosis present

## 2016-08-20 DIAGNOSIS — Z8249 Family history of ischemic heart disease and other diseases of the circulatory system: Secondary | ICD-10-CM

## 2016-08-20 DIAGNOSIS — Z833 Family history of diabetes mellitus: Secondary | ICD-10-CM | POA: Diagnosis not present

## 2016-08-20 DIAGNOSIS — O4703 False labor before 37 completed weeks of gestation, third trimester: Secondary | ICD-10-CM

## 2016-08-20 DIAGNOSIS — Z3A35 35 weeks gestation of pregnancy: Secondary | ICD-10-CM | POA: Diagnosis not present

## 2016-08-20 DIAGNOSIS — O99214 Obesity complicating childbirth: Secondary | ICD-10-CM | POA: Diagnosis present

## 2016-08-20 DIAGNOSIS — Z6831 Body mass index (BMI) 31.0-31.9, adult: Secondary | ICD-10-CM | POA: Diagnosis not present

## 2016-08-20 DIAGNOSIS — O368130 Decreased fetal movements, third trimester, not applicable or unspecified: Secondary | ICD-10-CM | POA: Diagnosis present

## 2016-08-20 DIAGNOSIS — O368190 Decreased fetal movements, unspecified trimester, not applicable or unspecified: Secondary | ICD-10-CM

## 2016-08-20 DIAGNOSIS — Z880 Allergy status to penicillin: Secondary | ICD-10-CM | POA: Diagnosis not present

## 2016-08-20 HISTORY — DX: Palpitations: R00.2

## 2016-08-20 LAB — URINALYSIS, ROUTINE W REFLEX MICROSCOPIC
Bilirubin Urine: NEGATIVE
GLUCOSE, UA: NEGATIVE mg/dL
HGB URINE DIPSTICK: NEGATIVE
Ketones, ur: NEGATIVE mg/dL
Nitrite: NEGATIVE
PH: 7.5 (ref 5.0–8.0)
Protein, ur: NEGATIVE mg/dL
Specific Gravity, Urine: 1.015 (ref 1.005–1.030)

## 2016-08-20 LAB — URINE MICROSCOPIC-ADD ON: RBC / HPF: NONE SEEN RBC/hpf (ref 0–5)

## 2016-08-20 LAB — CBC
HCT: 35.3 % — ABNORMAL LOW (ref 36.0–46.0)
HEMOGLOBIN: 11.9 g/dL — AB (ref 12.0–15.0)
MCH: 29 pg (ref 26.0–34.0)
MCHC: 33.7 g/dL (ref 30.0–36.0)
MCV: 85.9 fL (ref 78.0–100.0)
Platelets: 194 10*3/uL (ref 150–400)
RBC: 4.11 MIL/uL (ref 3.87–5.11)
RDW: 15.6 % — ABNORMAL HIGH (ref 11.5–15.5)
WBC: 8.1 10*3/uL (ref 4.0–10.5)

## 2016-08-20 LAB — TYPE AND SCREEN
ABO/RH(D): O POS
ANTIBODY SCREEN: NEGATIVE

## 2016-08-20 LAB — GROUP B STREP BY PCR: Group B strep by PCR: NEGATIVE

## 2016-08-20 LAB — AMNISURE RUPTURE OF MEMBRANE (ROM) NOT AT ARMC: Amnisure ROM: NEGATIVE

## 2016-08-20 LAB — ABO/RH: ABO/RH(D): O POS

## 2016-08-20 MED ORDER — BETAMETHASONE SOD PHOS & ACET 6 (3-3) MG/ML IJ SUSP
12.0000 mg | INTRAMUSCULAR | Status: DC
  Administered 2016-08-20: 12 mg via INTRAMUSCULAR
  Filled 2016-08-20 (×2): qty 2

## 2016-08-20 MED ORDER — OXYTOCIN 40 UNITS IN LACTATED RINGERS INFUSION - SIMPLE MED
1.0000 m[IU]/min | INTRAVENOUS | Status: DC
  Administered 2016-08-20: 1 m[IU]/min via INTRAVENOUS

## 2016-08-20 MED ORDER — OXYCODONE-ACETAMINOPHEN 5-325 MG PO TABS: 2.0000 | ORAL_TABLET | ORAL | Status: DC | PRN

## 2016-08-20 MED ORDER — LIDOCAINE HCL (PF) 1 % IJ SOLN
INTRAMUSCULAR | Status: DC | PRN
  Administered 2016-08-20 (×2): 4 mL

## 2016-08-20 MED ORDER — LACTATED RINGERS IV SOLN
INTRAVENOUS | Status: DC
  Administered 2016-08-20: 19:00:00 via INTRAVENOUS

## 2016-08-20 MED ORDER — DOCUSATE SODIUM 100 MG PO CAPS: 100.0000 mg | ORAL_CAPSULE | Freq: Every day | ORAL | Status: DC

## 2016-08-20 MED ORDER — FLEET ENEMA 7-19 GM/118ML RE ENEM: 1.0000 | ENEMA | RECTAL | Status: DC | PRN

## 2016-08-20 MED ORDER — OXYCODONE-ACETAMINOPHEN 5-325 MG PO TABS: 1.0000 | ORAL_TABLET | ORAL | Status: DC | PRN

## 2016-08-20 MED ORDER — EPHEDRINE 5 MG/ML INJ
10.0000 mg | INTRAVENOUS | Status: DC | PRN
Start: 2016-08-20 — End: 2016-08-21
  Filled 2016-08-20: qty 4

## 2016-08-20 MED ORDER — LACTATED RINGERS IV SOLN: 500.0000 mL | INTRAVENOUS | Status: DC | PRN

## 2016-08-20 MED ORDER — FENTANYL 2.5 MCG/ML BUPIVACAINE 1/10 % EPIDURAL INFUSION (WH - ANES)
14.0000 mL/h | INTRAMUSCULAR | Status: DC | PRN
  Administered 2016-08-20: 14 mL/h via EPIDURAL
  Filled 2016-08-20: qty 125

## 2016-08-20 MED ORDER — DIPHENHYDRAMINE HCL 50 MG/ML IJ SOLN: 12.5000 mg | INTRAMUSCULAR | Status: DC | PRN

## 2016-08-20 MED ORDER — OXYTOCIN BOLUS FROM INFUSION
500.0000 mL | Freq: Once | INTRAVENOUS | Status: AC
  Administered 2016-08-21: 500 mL via INTRAVENOUS

## 2016-08-20 MED ORDER — ACETAMINOPHEN 325 MG PO TABS: 650.0000 mg | ORAL_TABLET | ORAL | Status: DC | PRN

## 2016-08-20 MED ORDER — LACTATED RINGERS IV SOLN: 500.0000 mL | Freq: Once | INTRAVENOUS | Status: DC

## 2016-08-20 MED ORDER — SOD CITRATE-CITRIC ACID 500-334 MG/5ML PO SOLN: 30.0000 mL | ORAL | Status: DC | PRN

## 2016-08-20 MED ORDER — ONDANSETRON HCL 4 MG/2ML IJ SOLN: 4.0000 mg | Freq: Four times a day (QID) | INTRAMUSCULAR | Status: DC | PRN

## 2016-08-20 MED ORDER — ZOLPIDEM TARTRATE 5 MG PO TABS: 5.0000 mg | ORAL_TABLET | Freq: Every evening | ORAL | Status: DC | PRN

## 2016-08-20 MED ORDER — PHENYLEPHRINE 40 MCG/ML (10ML) SYRINGE FOR IV PUSH (FOR BLOOD PRESSURE SUPPORT)
80.0000 ug | PREFILLED_SYRINGE | INTRAVENOUS | Status: DC | PRN
  Filled 2016-08-20: qty 5

## 2016-08-20 MED ORDER — PRENATAL MULTIVITAMIN CH: 1.0000 | ORAL_TABLET | Freq: Every day | ORAL | Status: DC

## 2016-08-20 MED ORDER — TERBUTALINE SULFATE 1 MG/ML IJ SOLN
0.2500 mg | Freq: Once | INTRAMUSCULAR | Status: DC | PRN
  Filled 2016-08-20: qty 1

## 2016-08-20 MED ORDER — CALCIUM CARBONATE ANTACID 500 MG PO CHEW: 2.0000 | CHEWABLE_TABLET | ORAL | Status: DC | PRN

## 2016-08-20 MED ORDER — PHENYLEPHRINE 40 MCG/ML (10ML) SYRINGE FOR IV PUSH (FOR BLOOD PRESSURE SUPPORT)
80.0000 ug | PREFILLED_SYRINGE | INTRAVENOUS | Status: DC | PRN
  Filled 2016-08-20: qty 5
  Filled 2016-08-20: qty 10

## 2016-08-20 MED ORDER — LIDOCAINE HCL (PF) 1 % IJ SOLN
30.0000 mL | INTRAMUSCULAR | Status: DC | PRN
  Administered 2016-08-21: 30 mL via SUBCUTANEOUS
  Filled 2016-08-20: qty 30

## 2016-08-20 MED ORDER — OXYTOCIN 40 UNITS IN LACTATED RINGERS INFUSION - SIMPLE MED
2.5000 [IU]/h | INTRAVENOUS | Status: DC
  Administered 2016-08-21: 2.5 [IU]/h via INTRAVENOUS
  Filled 2016-08-20: qty 1000

## 2016-08-20 MED ORDER — FENTANYL CITRATE (PF) 100 MCG/2ML IJ SOLN: 100.0000 ug | INTRAMUSCULAR | Status: DC | PRN

## 2016-08-20 MED ORDER — LACTATED RINGERS IV SOLN
INTRAVENOUS | Status: DC
  Administered 2016-08-20: 22:00:00 via INTRAVENOUS

## 2016-08-20 NOTE — MAU Provider Note (Signed)
Chief Complaint:  Rupture of Membranes   First Provider Initiated Contact with Patient 08/20/16 1006      HPI: Tasha SPIEWAK is a 30 y.o. G3P1011 at [redacted]w[redacted]d who presents to maternity admissions reporting cramping/contractions starting 2 hours before an episode of fluid leakage then decreased fetal movement since 5 am.  She had leakage of clear fluid that would not stop while sitting on the toilet this morning.  She reports she urinated and finished but then had gush of fluid and could not make leaking stop.  After urinating, she put on a pad, which is wet but not soaked on arrival to MAU. She reports she had a slow leak of fluid with her first pregnancy and it was hard to prove so she is concerned.  Contractions are worsening since onset and are getting closer together. She denies vaginal bleeding, vaginal itching/burning, urinary symptoms, h/a, dizziness, n/v, or fever/chills.    HPI  Past Medical History: Past Medical History:  Diagnosis Date  . Abnormal Pap smear 04/2009   Colpo;was normal;Last pap 04/2011  . Anemia 2005;2008   needed iron supplements  . BV (bacterial vaginosis) 2011  . Candida vaginitis 03/15/12  . H/O nausea 2011  . Headache(784.0)    in the past  . Heart palpitations   . HPV in female   . Hx: UTI (urinary tract infection)     Past obstetric history: OB History  Gravida Para Term Preterm AB Living  3 1 1   1 1   SAB TAB Ectopic Multiple Live Births  1       1    # Outcome Date GA Lbr Len/2nd Weight Sex Delivery Anes PTL Lv  3 Current           2 Term 03/22/13 [redacted]w[redacted]d 13:50 / 02:08 6 lb 5 oz (2.863 kg) F Vag-Spont EPI  LIV  1 SAB  [redacted]w[redacted]d       DEC      Past Surgical History: Past Surgical History:  Procedure Laterality Date  . WISDOM TOOTH EXTRACTION  2004   All 4 are removed    Family History: Family History  Problem Relation Age of Onset  . Hypertension Maternal Grandmother   . Aneurysm Maternal Grandmother   . Hypertension Maternal Grandfather   .  Diabetes Maternal Grandfather   . Heart disease Maternal Grandfather   . Hypertension Paternal Grandmother   . Alzheimer's disease Paternal Grandmother   . Hypertension Paternal Grandfather   . Hypertension Maternal Uncle   . Hypertension Paternal Uncle   . Diabetes Father   . Diabetes Maternal Uncle   . Diabetes Paternal Uncle   . Heart disease Maternal Uncle   . Seizures Maternal Uncle   . Cancer Maternal Uncle     Eye  . Kidney failure Mother     d/t Hoover Brunette  . Cancer Mother     Leukemia  . Nephrolithiasis Maternal Uncle   . Cancer Maternal Uncle     brain  . Cancer Maternal Uncle     lung  . Rheum arthritis Paternal Uncle   . Multiple sclerosis Cousin     maternal  . Cancer Other     FOB father :walden strum's    Social History: Social History  Substance Use Topics  . Smoking status: Never Smoker  . Smokeless tobacco: Never Used  . Alcohol use No    Allergies:  Allergies  Allergen Reactions  . Penicillins     Childhood allergy.  Reaction  unknown Has patient had a PCN reaction causing immediate rash, facial/tongue/throat swelling, SOB or lightheadedness with hypotension: unknown Has patient had a PCN reaction causing severe rash involving mucus membranes or skin necrosis: unknown Has patient had a PCN reaction that required hospitalization No Has patient had a PCN reaction occurring within the last 10 years: No If all of the above answers are "NO", then may proceed with Cephalosporin use.     Meds:  Prescriptions Prior to Admission  Medication Sig Dispense Refill Last Dose  . Docosahexaenoic Acid (DHA OMEGA 3 PO) Take 1 tablet by mouth daily.    08/19/2016 at 1900  . Prenatal Vit-Fe Sulfate-FA (PRENATAL VITAMIN PO) Take 1 tablet by mouth daily.    08/19/2016 at 1900    ROS:  Review of Systems  Constitutional: Negative for chills, fatigue and fever.  Eyes: Negative for visual disturbance.  Respiratory: Negative for shortness of breath.   Cardiovascular:  Negative for chest pain.  Gastrointestinal: Positive for abdominal pain. Negative for nausea and vomiting.  Genitourinary: Positive for vaginal discharge. Negative for difficulty urinating, dysuria, flank pain, pelvic pain, vaginal bleeding and vaginal pain.  Neurological: Negative for dizziness and headaches.  Psychiatric/Behavioral: Negative.      I have reviewed patient's Past Medical Hx, Surgical Hx, Family Hx, Social Hx, medications and allergies.   Physical Exam   Patient Vitals for the past 24 hrs:  BP Temp Temp src Pulse Resp SpO2 Height Weight  08/20/16 1203 - - - 93 - 98 % - -  08/20/16 1201 - - - 102 - 98 % - -  08/20/16 1159 - - - 96 - 98 % - -  08/20/16 1118 - - - 100 - 97 % - -  08/20/16 1112 - - - 102 - 100 % - -  08/20/16 1100 - - - 108 - 99 % - -  08/20/16 1003 - - - 112 - 96 % - -  08/20/16 0932 - - - 111 - 100 % - -  08/20/16 0927 - - - 104 - 100 % - -  08/20/16 0921 112/66 98.5 F (36.9 C) Oral 108 18 - 5\' 4"  (1.626 m) 185 lb (83.9 kg)   Constitutional: Well-developed, well-nourished female in no acute distress.  Cardiovascular: normal rate Respiratory: normal effort GI: Abd soft, non-tender, gravid appropriate for gestational age.  MS: Extremities nontender, no edema, normal ROM Neurologic: Alert and oriented x 4.  GU: Neg CVAT.  PELVIC EXAM: Negative pooling with valsalva.  Small amount creamy white vaginal discharge.  Amnisure collected.  Dilation: 5 Effacement (%): 70 Station: 0 Presentation: Vertex Exam by:: Etha Stambaugh leftwich-kirby,cnm   Cervix changed to 5/70/0 in 3 hours in MAU  FHT:  Baseline 150 , moderate variability, accelerations present, no decelerations Contractions: q 3-4 mins, mild to moderate to palpation   Labs: Results for orders placed or performed during the hospital encounter of 08/20/16 (from the past 24 hour(s))  Urinalysis, Routine w reflex microscopic (not at [facility])     Status: Abnormal   Collection Time: 08/20/16  9:05 AM   Result Value Ref Range   Color, Urine YELLOW YELLOW   APPearance CLEAR CLEAR   Specific Gravity, Urine 1.015 1.005 - 1.030   pH 7.5 5.0 - 8.0   Glucose, UA NEGATIVE NEGATIVE mg/dL   Hgb urine dipstick NEGATIVE NEGATIVE   Bilirubin Urine NEGATIVE NEGATIVE   Ketones, ur NEGATIVE NEGATIVE mg/dL   Protein, ur NEGATIVE NEGATIVE mg/dL   Nitrite NEGATIVE NEGATIVE  Leukocytes, UA LARGE (A) NEGATIVE  Urine microscopic-add on     Status: Abnormal   Collection Time: 08/20/16  9:05 AM  Result Value Ref Range   Squamous Epithelial / LPF 0-5 (A) NONE SEEN   WBC, UA 6-30 0 - 5 WBC/hpf   RBC / HPF NONE SEEN 0 - 5 RBC/hpf   Bacteria, UA RARE (A) NONE SEEN  Amnisure rupture of membrane (rom)not at [facility]     Status: None   Collection Time: 08/20/16 10:00 AM  Result Value Ref Range   Amnisure ROM NEGATIVE       Imaging:  No results found.   BPP preliminary results with AFI 12 and 6/8 with some breathing but not sustained  MAU Course/MDM: I have ordered labs and reviewed results.  Consult Dr Stefano Gaul.  No evidence of ROM but cervical change warrants further evaluation. BPP/NST with 8/10 reassuring.  Antepartum admission for observation.  BMZ injection ordered.  Pt stable at time of transfer.  Assessment: 1. Threatened preterm labor, third trimester   2. Decreased fetal movement     Plan: Admit to antepartum BMZ x 2, 24 hours apart, first dose now Dr Stefano Gaul to see pt on admission   Sharen Counter Certified Nurse-Midwife 08/20/2016 2:27 PM

## 2016-08-20 NOTE — MAU Note (Signed)
Pt c/o leaking fluid starting at 0745 this morning. Pt states baby has not been moving normally today. Pt reports one movement since 5am. Pt denies bleeding. Pt reports contractions about every 4 minutes.

## 2016-08-20 NOTE — Anesthesia Procedure Notes (Signed)
Epidural Patient location during procedure: OB  Staffing Anesthesiologist: Labrisha Wuellner Performed: anesthesiologist   Preanesthetic Checklist Completed: patient identified, site marked, surgical consent, pre-op evaluation, timeout performed, IV checked, risks and benefits discussed and monitors and equipment checked  Epidural Patient position: sitting Prep: site prepped and draped and DuraPrep Patient monitoring: continuous pulse ox and blood pressure Approach: midline Location: L3-L4 Injection technique: LOR saline  Needle:  Needle type: Tuohy  Needle gauge: 17 G Needle length: 9 cm and 9 Needle insertion depth: 7 cm Catheter type: closed end flexible Catheter size: 19 Gauge Catheter at skin depth: 12 cm Test dose: negative  Assessment Events: blood not aspirated, injection not painful, no injection resistance, negative IV test and no paresthesia  Additional Notes Patient identified. Risks/Benefits/Options discussed with patient including but not limited to bleeding, infection, nerve damage, paralysis, failed block, incomplete pain control, headache, blood pressure changes, nausea, vomiting, reactions to medication both or allergic, itching and postpartum back pain. Confirmed with bedside nurse the patient's most recent platelet count. Confirmed with patient that they are not currently taking any anticoagulation, have any bleeding history or any family history of bleeding disorders. Patient expressed understanding and wished to proceed. All questions were answered. Sterile technique was used throughout the entire procedure. Please see nursing notes for vital signs. Test dose was given through epidural catheter and negative prior to continuing to dose epidural or start infusion. Warning signs of high block given to the patient including shortness of breath, tingling/numbness in hands, complete motor block, or any concerning symptoms with instructions to call for help. Patient was given  instructions on fall risk and not to get out of bed. All questions and concerns addressed with instructions to call with any issues or inadequate analgesia.        

## 2016-08-20 NOTE — H&P (Signed)
HPI: Tasha Collins is a 30 y.o. G3P1011 at [redacted]w[redacted]d who presents to maternity admissions reporting cramping/contractions starting 2 hours before an episode of fluid leakage then decreased fetal movement since 5 am.  She had leakage of clear fluid that would not stop while sitting on the toilet this morning.  She reports she urinated and finished but then had gush of fluid and could not make leaking stop.  After urinating, she put on a pad, which is wet but not soaked on arrival to MAU. She reports she had a slow leak of fluid with her first pregnancy and it was hard to prove so she is concerned.  Contractions are worsening since onset and are getting closer together. She denies vaginal bleeding, vaginal itching/burning, urinary symptoms, h/a, dizziness, n/v, or fever/chills.    HPI  Past Medical History:     Past Medical History:  Diagnosis Date  . Abnormal Pap smear 04/2009   Colpo;was normal;Last pap 04/2011  . Anemia 2005;2008   needed iron supplements  . BV (bacterial vaginosis) 2011  . Candida vaginitis 03/15/12  . H/O nausea 2011  . Headache(784.0)    in the past  . Heart palpitations   . HPV in female   . Hx: UTI (urinary tract infection)     Past obstetric history:                 OB History  Gravida Para Term Preterm AB Living  3 1 1   1 1   SAB TAB Ectopic Multiple Live Births  1       1    # Outcome Date GA Lbr Len/2nd Weight Sex Delivery Anes PTL Lv  3 Current           2 Term 03/22/13 [redacted]w[redacted]d 13:50 / 02:08 6 lb 5 oz (2.863 kg) F Vag-Spont EPI  LIV  1 SAB  [redacted]w[redacted]d       DEC      Past Surgical History:      Past Surgical History:  Procedure Laterality Date  . WISDOM TOOTH EXTRACTION  2004   All 4 are removed    Family History:       Family History  Problem Relation Age of Onset  . Hypertension Maternal Grandmother   . Aneurysm Maternal Grandmother   . Hypertension Maternal Grandfather   . Diabetes Maternal Grandfather   .  Heart disease Maternal Grandfather   . Hypertension Paternal Grandmother   . Alzheimer's disease Paternal Grandmother   . Hypertension Paternal Grandfather   . Hypertension Maternal Uncle   . Hypertension Paternal Uncle   . Diabetes Father   . Diabetes Maternal Uncle   . Diabetes Paternal Uncle   . Heart disease Maternal Uncle   . Seizures Maternal Uncle   . Cancer Maternal Uncle     Eye  . Kidney failure Mother     d/t Hoover Brunette  . Cancer Mother     Leukemia  . Nephrolithiasis Maternal Uncle   . Cancer Maternal Uncle     brain  . Cancer Maternal Uncle     lung  . Rheum arthritis Paternal Uncle   . Multiple sclerosis Cousin     maternal  . Cancer Other     FOB father :walden strum's    Social History:     Social History  Substance Use Topics  . Smoking status: Never Smoker  . Smokeless tobacco: Never Used  . Alcohol use No    Allergies:  Allergies  Allergen Reactions  . Penicillins     Childhood allergy.  Reaction unknown Has patient had a PCN reaction causing immediate rash, facial/tongue/throat swelling, SOB or lightheadedness with hypotension: unknown Has patient had a PCN reaction causing severe rash involving mucus membranes or skin necrosis: unknown Has patient had a PCN reaction that required hospitalization No Has patient had a PCN reaction occurring within the last 10 years: No If all of the above answers are "NO", then may proceed with Cephalosporin use.    Meds:         Prescriptions Prior to Admission  Medication Sig Dispense Refill Last Dose  . Docosahexaenoic Acid (DHA OMEGA 3 PO) Take 1 tablet by mouth daily.    08/19/2016 at 1900  . Prenatal Vit-Fe Sulfate-FA (PRENATAL VITAMIN PO) Take 1 tablet by mouth daily.    08/19/2016 at 1900    ROS:  Review of Systems  Constitutional: Negative for chills, fatigue and fever.  Eyes: Negative for visual disturbance.  Respiratory: Negative for  shortness of breath.   Cardiovascular: Negative for chest pain.  Gastrointestinal: Positive for abdominal pain. Negative for nausea and vomiting.  Genitourinary: Positive for vaginal discharge. Negative for difficulty urinating, dysuria, flank pain, pelvic pain, vaginal bleeding and vaginal pain.  Neurological: Negative for dizziness and headaches.  Psychiatric/Behavioral: Negative.      I have reviewed patient's Past Medical Hx, Surgical Hx, Family Hx, Social Hx, medications and allergies.   Physical Exam   Patient Vitals for the past 24 hrs:  BP Temp Temp src Pulse Resp SpO2 Height Weight  08/20/16 1203 - - - 93 - 98 % - -  08/20/16 1201 - - - 102 - 98 % - -  08/20/16 1159 - - - 96 - 98 % - -  08/20/16 1118 - - - 100 - 97 % - -  08/20/16 1112 - - - 102 - 100 % - -  08/20/16 1100 - - - 108 - 99 % - -  08/20/16 1003 - - - 112 - 96 % - -  08/20/16 0932 - - - 111 - 100 % - -  08/20/16 0927 - - - 104 - 100 % - -  08/20/16 0921 112/66 98.5 F (36.9 C) Oral 108 18 - 5\' 4"  (1.626 m) 185 lb (83.9 kg)   Constitutional: Well-developed, well-nourished female in no acute distress.  Cardiovascular: normal rate Respiratory: normal effort GI: Abd soft, non-tender, gravid appropriate for gestational age.  MS: Extremities nontender, no edema, normal ROM Neurologic: Alert and oriented x 4.  GU: Neg CVAT.  PELVIC EXAM: Negative pooling with valsalva.  Small amount creamy white vaginal discharge.  Amnisure collected.  Dilation: 5 Effacement (%): 70 Station: 0 Presentation: Vertex Exam by:: lisa leftwich-kirby,cnm   Cervix changed to 5/70/0 in 3 hours in MAU  FHT:  Baseline 150 , moderate variability, accelerations present, no decelerations Contractions: q 3-4 mins, mild to moderate to palpation   Labs: Lab Results Last 24 Hours       Results for orders placed or performed during the hospital encounter of 08/20/16 (from the past 24 hour(s))  Urinalysis, Routine w reflex  microscopic (not at [facility])     Status: Abnormal   Collection Time: 08/20/16  9:05 AM  Result Value Ref Range   Color, Urine YELLOW YELLOW   APPearance CLEAR CLEAR   Specific Gravity, Urine 1.015 1.005 - 1.030   pH 7.5 5.0 - 8.0   Glucose, UA NEGATIVE NEGATIVE mg/dL  Hgb urine dipstick NEGATIVE NEGATIVE   Bilirubin Urine NEGATIVE NEGATIVE   Ketones, ur NEGATIVE NEGATIVE mg/dL   Protein, ur NEGATIVE NEGATIVE mg/dL   Nitrite NEGATIVE NEGATIVE   Leukocytes, UA LARGE (A) NEGATIVE  Urine microscopic-add on     Status: Abnormal   Collection Time: 08/20/16  9:05 AM  Result Value Ref Range   Squamous Epithelial / LPF 0-5 (A) NONE SEEN   WBC, UA 6-30 0 - 5 WBC/hpf   RBC / HPF NONE SEEN 0 - 5 RBC/hpf   Bacteria, UA RARE (A) NONE SEEN  Amnisure rupture of membrane (rom)not at [facility]     Status: None   Collection Time: 08/20/16 10:00 AM  Result Value Ref Range   Amnisure ROM NEGATIVE         Imaging:  Imaging Results  No results found.     BPP preliminary results with AFI 12 and 6/8 with some breathing but not sustained  MAU Course/MDM: I have ordered labs and reviewed results.  Consult Dr Stefano Gaul.  No evidence of ROM but cervical change warrants further evaluation. BPP/NST with 8/10 reassuring.  Antepartum admission for observation.  BMZ injection ordered.  Pt stable at time of transfer.  Assessment: 1. Threatened preterm labor, third trimester   2. Decreased fetal movement     Plan: Admit to antepartum BMZ x 2, 24 hours apart, first dose now Dr Stefano Gaul to see pt on admission   Sharen Counter Certified Nurse-Midwife 08/20/2016 2:27 PM    Cosigned by: Kirkland Hun, MD at 08/20/2016 4:13 PM   The patient continues to have irregular contractions that she perceives as increasing in intensity.  BP 128/69   Pulse (!) 102   Temp 98.5 F (36.9 C) (Oral)   Resp 18   Ht 5\' 4"  (1.626 m)   Wt 185 lb (83.9 kg)   LMP 12/19/2015   SpO2 98%    BMI 31.76 kg/m   Fetal heart tracing: Category 1  Contractions: Mild to moderate every 3-10 minutes  Cervix: 4 cm, 50% effaced, -1 station, posterior  The patient continues to have uterine contractions but no definitive labor. I believe the patient is too uncomfortable to be discharged home. She has received 1 dose of betamethasone. We will give IV fluids. We will observe overnight. If the contractions resolved, then we will discharge to home.  Dr. Stefano Gaul 08/20/2016 6:55 PM

## 2016-08-20 NOTE — Progress Notes (Signed)
  Subjective: Requesting pain med--reports UCs still same strength.  Objective: BP 112/65 (BP Location: Right Arm)   Pulse 95   Temp 97.8 F (36.6 C) (Oral)   Resp 18   Ht 5\' 4"  (1.626 m)   Wt 83.9 kg (185 lb)   LMP 12/19/2015   SpO2 98%   BMI 31.76 kg/m  No intake/output data recorded. No intake/output data recorded.  FHT: Category 1 UC:   irregular, every 3-5 minutes SVE:   Dilation: 5 Effacement (%): 80 Station: 0 Exam by:: Nigel BridgemanVicki Harris Penton CNM   BBOW     Assessment:  Early labor at 35 weeks GBS negative  Plan: Will proceed with epidural placement and routine labor care.  Nigel BridgemanLATHAM, Jaree Trinka CNM 08/20/2016, 10:28 PM

## 2016-08-20 NOTE — Progress Notes (Signed)
  Subjective: Comfortable with epidural.  Objective: BP (!) 133/52   Pulse 96   Temp 97.8 F (36.6 C) (Oral)   Resp 18   Ht $RemoveBeforeD [id number]$5\' 4"  SpO2 100%   BMI 31.76 kg/m  No intake/output data recorded. No intake/output data recorded.  FHT: Category 1 UC:   regular, every 4-6 minutes SVE:  6-7, 60%, vtx, -1/0 BBOW--AROM, clear fluid IUPC placed  Assessment:  Active labor at 35 weeks GBS negative  Plan: Continue current care. Augment if inadequate labor.  Nigel BridgemanLATHAM, Jettson Crable CNM 08/20/2016, 11:40 PM

## 2016-08-20 NOTE — Anesthesia Preprocedure Evaluation (Signed)
Anesthesia Evaluation  Patient identified by MRN, date of birth, ID band Patient awake    Reviewed: Allergy & Precautions, NPO status , Patient's Chart, lab work & pertinent test results  Airway Mallampati: II  TM Distance: >3 FB Neck ROM: Full    Dental no notable dental hx. (+) Dental Advisory Given   Pulmonary neg pulmonary ROS,    Pulmonary exam normal breath sounds clear to auscultation       Cardiovascular negative cardio ROS Normal cardiovascular exam Rhythm:Regular Rate:Normal     Neuro/Psych  Headaches, negative psych ROS   GI/Hepatic negative GI ROS, Neg liver ROS,   Endo/Other  obesity  Renal/GU negative Renal ROS  negative genitourinary   Musculoskeletal negative musculoskeletal ROS (+)   Abdominal   Peds negative pediatric ROS (+)  Hematology negative hematology ROS (+)   Anesthesia Other Findings   Reproductive/Obstetrics (+) Pregnancy                             Anesthesia Physical Anesthesia Plan  ASA: II  Anesthesia Plan: Epidural   Post-op Pain Management:    Induction:   Airway Management Planned:   Additional Equipment:   Intra-op Plan:   Post-operative Plan:   Informed Consent: I have reviewed the patients History and Physical, chart, labs and discussed the procedure including the risks, benefits and alternatives for the proposed anesthesia with the patient or authorized representative who has indicated his/her understanding and acceptance.   Dental advisory given  Plan Discussed with: CRNA  Anesthesia Plan Comments:         Anesthesia Quick Evaluation  

## 2016-08-20 NOTE — Progress Notes (Signed)
  Subjective: Reports UCs stronger, "8/10", but feels she is coping well, denies need for pain med.   Prenatal Transfer Tool  Maternal Diabetes: No Genetic Screening: Declined Maternal Ultrasounds/Referrals: Normal Fetal Ultrasounds or other Referrals:  None Maternal Substance Abuse:  No Significant Maternal Medications:  None Significant Maternal Lab Results: Lab values include: Other: GBS pending from admission today.    Objective: BP 128/69   Pulse (!) 102   Temp 98.5 F (36.9 C) (Oral)   Resp 20   Ht 5\' 4"  (1.626 m)   Wt 83.9 kg (185 lb)   LMP 12/19/2015   SpO2 98%   BMI 31.76 kg/m  No intake/output data recorded. No intake/output data recorded.  FHT: Category 1 UC:   irregular, every 4-6 minutes SVE:   Dilation: 3.5 Effacement (%): 80 Station: -1, 0 Exam by:: Nigel BridgemanVicki Deniya Craigo CNM   IBOW noted GBS done by PCR and culture, due to PCN allergy  Assessment:  IUP at 35 weeks ? Prodromal vs early labor S/p betamethasone 1st dose PCN allergy  Plan: Reviewed status with patient and husband, with discussion of pre-term status and need for labor to declare itself before we would proceed with routine labor care. If GBS by PCR negative, no prophylaxis needed. If GBS by PCR positive, will defer treatment unless definitive labor established. GBS by culture pending. If GBS positive, will treat with Ancef, due to "mild" allergy to PCN. Pain med prn/Ambien for sleep prn.  Nigel BridgemanLATHAM, Serena Petterson CNM 08/20/2016, 7:42 PM

## 2016-08-21 ENCOUNTER — Encounter (HOSPITAL_COMMUNITY): Payer: Self-pay | Admitting: *Deleted

## 2016-08-21 LAB — RPR: RPR: NONREACTIVE

## 2016-08-21 MED ORDER — WITCH HAZEL-GLYCERIN EX PADS: 1.0000 "application " | MEDICATED_PAD | CUTANEOUS | Status: DC | PRN

## 2016-08-21 MED ORDER — ONDANSETRON HCL 4 MG PO TABS: 4.0000 mg | ORAL_TABLET | ORAL | Status: DC | PRN

## 2016-08-21 MED ORDER — TETANUS-DIPHTH-ACELL PERTUSSIS 5-2.5-18.5 LF-MCG/0.5 IM SUSP: 0.5000 mL | Freq: Once | INTRAMUSCULAR | Status: DC

## 2016-08-21 MED ORDER — DIPHENHYDRAMINE HCL 25 MG PO CAPS: 25.0000 mg | ORAL_CAPSULE | Freq: Four times a day (QID) | ORAL | Status: DC | PRN

## 2016-08-21 MED ORDER — ACETAMINOPHEN 325 MG PO TABS
650.0000 mg | ORAL_TABLET | ORAL | Status: DC | PRN
  Administered 2016-08-21: 650 mg via ORAL
  Filled 2016-08-21: qty 2

## 2016-08-21 MED ORDER — OXYCODONE HCL 5 MG PO TABS: 5.0000 mg | ORAL_TABLET | ORAL | Status: DC | PRN

## 2016-08-21 MED ORDER — ONDANSETRON HCL 4 MG/2ML IJ SOLN: 4.0000 mg | INTRAMUSCULAR | Status: DC | PRN

## 2016-08-21 MED ORDER — PRENATAL MULTIVITAMIN CH
1.0000 | ORAL_TABLET | Freq: Every day | ORAL | Status: DC
  Administered 2016-08-21 – 2016-08-22 (×2): 1 via ORAL
  Filled 2016-08-21 (×2): qty 1

## 2016-08-21 MED ORDER — COCONUT OIL OIL: 1.0000 "application " | TOPICAL_OIL | Status: DC | PRN

## 2016-08-21 MED ORDER — SENNOSIDES-DOCUSATE SODIUM 8.6-50 MG PO TABS
2.0000 | ORAL_TABLET | ORAL | Status: DC
  Administered 2016-08-21: 2 via ORAL
  Filled 2016-08-21: qty 2

## 2016-08-21 MED ORDER — ZOLPIDEM TARTRATE 5 MG PO TABS: 5.0000 mg | ORAL_TABLET | Freq: Every evening | ORAL | Status: DC | PRN

## 2016-08-21 MED ORDER — OXYCODONE HCL 5 MG PO TABS: 10.0000 mg | ORAL_TABLET | ORAL | Status: DC | PRN

## 2016-08-21 MED ORDER — BENZOCAINE-MENTHOL 20-0.5 % EX AERO
1.0000 "application " | INHALATION_SPRAY | CUTANEOUS | Status: DC | PRN
  Administered 2016-08-21: 1 via TOPICAL
  Filled 2016-08-21: qty 56

## 2016-08-21 MED ORDER — IBUPROFEN 600 MG PO TABS
600.0000 mg | ORAL_TABLET | Freq: Four times a day (QID) | ORAL | Status: DC
  Administered 2016-08-21 – 2016-08-22 (×5): 600 mg via ORAL
  Filled 2016-08-21 (×5): qty 1

## 2016-08-21 MED ORDER — DIBUCAINE 1 % RE OINT: 1.0000 "application " | TOPICAL_OINTMENT | RECTAL | Status: DC | PRN

## 2016-08-21 MED ORDER — SIMETHICONE 80 MG PO CHEW: 80.0000 mg | CHEWABLE_TABLET | ORAL | Status: DC | PRN

## 2016-08-21 NOTE — Discharge Summary (Signed)
[facility] Discharge Summary   Patient Name:   Tasha Collins DOB:     May 23, 1986 MRN:     [id number]  Date of Admission:   08/20/2016 Date of Discharge:  08/21/2016  Admitting diagnosis:    35wks, water broke Principal Problem:   Vaginal delivery Active Problems:   Preterm labor  Patient Active Problem List   Diagnosis Date Noted  . Vaginal delivery 08/21/2016  . Preterm labor 08/20/2016  . Penicillin allergy 09/21/2012      Discharge diagnosis:    35wks, water broke Principal Problem:   Vaginal delivery Active Problems:   Preterm labor  Patient Active Problem List   Diagnosis Date Noted  . Vaginal delivery 08/21/2016  . Preterm labor 08/20/2016  . Penicillin allergy 09/21/2012                                                                   Post partum procedures: NA  Type of Delivery:  SVB  Delivering Provider: Nigel BridgemanLATHAM, Samnang Shugars   Date of Delivery:  08/21/16  Newborn Data:    Live born female  Birth Weight: 6 lb 4.7 oz (2855 g) APGAR: 8, 9  Baby's Name:   Antonietta Barcelonathan Baby Feeding:   Breast Disposition:   home with mother  Complications:   None  Hospital course:      Onset of Labor With Vaginal Delivery     [age] y.o. yo [id number] at 169w1d was admitted in Latent Labor on 08/20/2016 at 35 1/7 weeks--she was observed, given single dose of betamethasone, and then began to progress in labor.  GBS was negative. Patient had an uncomplicated labor course as follows:  Membrane Rupture Time/Date: 11:34 PM ,08/20/2016   Intrapartum Procedures: Episiotomy: None [1]                                         Lacerations:  1st degree [2];Perineal [11]  Patient had a delivery of a Viable infant. 08/21/2016  Information for the patient's newborn:  Marcene DuosFord, Boy Valree [[id number]][[id number]]  Delivery Method: Vaginal, Spontaneous Delivery (Filed from Delivery Summary)    Pateint had an uncomplicated postpartum course.  She is ambulating, tolerating a regular diet, passing flatus, and urinating  well. Patient is discharged home in stable condition on 08/21/16.    Physical Exam:  Vitals:   08/21/16 0745 08/21/16 1130 08/21/16 1816 08/21/16 2027  BP: 111/62 112/64 (!) 114/59 118/61  Pulse: 89 (!) 102 (!) 101 87  Resp: 20 20 16    Temp: 98.3 F (36.8 C) 98.5 F (36.9 C) 98.4 F (36.9 C) 98.3 F (36.8 C)  TempSrc: Oral Oral Oral   SpO2:   100%   Weight:      Height:       General: alert Lochia: appropriate Uterine Fundus: firm Incision: Healing well with no significant drainage DVT Evaluation: No evidence of DVT seen on physical exam. Negative Homan's sign.  Labs:  CBC Latest Ref Rng & Units 08/20/2016 06/21/2016 03/23/2013  WBC 4.0 - 10.5 K/uL 8.1 6.4 11.8(H)  Hemoglobin 12.0 - 15.0 g/dL 11.9(L) 10.7(L) 12.0  Hematocrit 36.0 - 46.0 % 35.3(L) 31.9(L) 35.9(L)  Platelets  150 - 400 K/uL 194 227 154     Discharge instruction: per After Visit Summary and "Baby and Me Booklet".  After Visit Meds:    Medication List    TAKE these medications   DHA OMEGA 3 PO Take 1 tablet by mouth daily.   ibuprofen 600 MG tablet Commonly known as:  ADVIL,MOTRIN Take 1 tablet (600 mg total) by mouth every 6 (six) hours as needed.   PRENATAL VITAMIN PO Take 1 tablet by mouth daily.       Diet: routine diet  Activity: Advance as tolerated. Pelvic rest for 6 weeks.   Outpatient follow up:6 weeks Follow up Appt:No future appointments. Follow up visit: No Follow-up on file.  Postpartum contraception: Undecided--considering Nexplanon, had issue with IUD migrating in past.  Information given on Nexplanon at d/c.  08/21/2016 Nigel Bridgeman, CNM

## 2016-08-21 NOTE — Progress Notes (Signed)
  Subjective: Feeling some pressure.  Objective: BP 129/62   Pulse 99   Temp 97.8 F (36.6 C) (Oral)   Resp 18   Ht 5\' 4"  (1.626 m)   Wt 83.9 kg (185 lb)   LMP 12/19/2015   SpO2 100%   BMI 31.76 kg/m  No intake/output data recorded. No intake/output data recorded.  FHT: Category 1 UC:   regular, every 3-4 minutes SVE:   7 cm, 0 station by RN exam Pitocin at 5 mu/min  Assessment:  Active labor  Plan: Continue to observe--position to facilitate rotation/descent.  Nigel BridgemanLATHAM, Shahara Hartsfield CNM 08/21/2016, 1:59 AM

## 2016-08-21 NOTE — Anesthesia Postprocedure Evaluation (Signed)
Anesthesia Post Note  Patient: Hospital doctorAmber A Collins  Procedure(s) Performed: * No procedures listed *  Patient location during evaluation: Mother Baby Anesthesia Type: Epidural Level of consciousness: awake and alert Pain management: pain level controlled Vital Signs Assessment: post-procedure vital signs reviewed and stable Respiratory status: spontaneous breathing and nonlabored ventilation Cardiovascular status: stable Postop Assessment: no headache, patient able to bend at knees, no backache, no signs of nausea or vomiting, epidural receding and adequate PO intake Anesthetic complications: no     Last Vitals:  Vitals:   08/21/16 0643 08/21/16 0745  BP: 111/74 111/62  Pulse: (!) 101 89  Resp: 18 20  Temp: 36.9 C 36.8 C    Last Pain:  Vitals:   08/21/16 0745  TempSrc: Oral  PainSc: 0-No pain   Pain Goal: Patients Stated Pain Goal: 0 (08/20/16 1600)               Sharrell Krawiec Hristova

## 2016-08-22 LAB — CBC
HEMATOCRIT: 31.4 % — AB (ref 36.0–46.0)
HEMOGLOBIN: 10.3 g/dL — AB (ref 12.0–15.0)
MCH: 28.3 pg (ref 26.0–34.0)
MCHC: 32.8 g/dL (ref 30.0–36.0)
MCV: 86.3 fL (ref 78.0–100.0)
Platelets: 198 10*3/uL (ref 150–400)
RBC: 3.64 MIL/uL — ABNORMAL LOW (ref 3.87–5.11)
RDW: 15.7 % — ABNORMAL HIGH (ref 11.5–15.5)
WBC: 14.7 10*3/uL — ABNORMAL HIGH (ref 4.0–10.5)

## 2016-08-22 LAB — CULTURE, BETA STREP (GROUP B ONLY)

## 2016-08-22 MED ORDER — IBUPROFEN 600 MG PO TABS: 600.0000 mg | ORAL_TABLET | Freq: Four times a day (QID) | ORAL | 2 refills | Status: AC | PRN

## 2016-08-22 NOTE — Discharge Instructions (Signed)
Postpartum Care After Vaginal Delivery °After you deliver your newborn (postpartum period), the usual stay in the hospital is 24-72 hours. If there were problems with your labor or delivery, or if you have other medical problems, you might be in the hospital longer.  °While you are in the hospital, you will receive help and instructions on how to care for yourself and your newborn during the postpartum period.  °While you are in the hospital: °· Be sure to tell your nurses if you have pain or discomfort, as well as where you feel the pain and what makes the pain worse. °· If you had an incision made near your vagina (episiotomy) or if you had some tearing during delivery, the nurses may put ice packs on your episiotomy or tear. The ice packs may help to reduce the pain and swelling. °· If you are breastfeeding, you may feel uncomfortable contractions of your uterus for a couple of weeks. This is normal. The contractions help your uterus get back to normal size. °· It is normal to have some bleeding after delivery. °¨ For the first 1-3 days after delivery, the flow is red and the amount may be similar to a period. °¨ It is common for the flow to start and stop. °¨ In the first few days, you may pass some small clots. Let your nurses know if you begin to pass large clots or your flow increases. °¨ Do not  flush blood clots down the toilet before having the nurse look at them. °¨ During the next 3-10 days after delivery, your flow should become more watery and pink or brown-tinged in color. °¨ Ten to fourteen days after delivery, your flow should be a small amount of yellowish-white discharge. °¨ The amount of your flow will decrease over the first few weeks after delivery. Your flow may stop in 6-8 weeks. Most women have had their flow stop by 12 weeks after delivery. °· You should change your sanitary pads frequently. °· Wash your hands thoroughly with soap and water for at least 20 seconds after changing pads, using  the toilet, or before holding or feeding your newborn. °· You should feel like you need to empty your bladder within the first 6-8 hours after delivery. °· In case you become weak, lightheaded, or faint, call your nurse before you get out of bed for the first time and before you take a shower for the first time. °· Within the first few days after delivery, your breasts may begin to feel tender and full. This is called engorgement. Breast tenderness usually goes away within 48-72 hours after engorgement occurs. You may also notice milk leaking from your breasts. If you are not breastfeeding, do not stimulate your breasts. Breast stimulation can make your breasts produce more milk. °· Spending as much time as possible with your newborn is very important. During this time, you and your newborn can feel close and get to know each other. Having your newborn stay in your room (rooming in) will help to strengthen the bond with your newborn.  It will give you time to get to know your newborn and become comfortable caring for your newborn. °· Your hormones change after delivery. Sometimes the hormone changes can temporarily cause you to feel sad or tearful. These feelings should not last more than a few days. If these feelings last longer than that, you should talk to your caregiver. °· If desired, talk to your caregiver about methods of family planning or contraception. °·   Talk to your caregiver about immunizations. Your caregiver may want you to have the following immunizations before leaving the hospital:  Tetanus, diphtheria, and pertussis (Tdap) or tetanus and diphtheria (Td) immunization. It is very important that you and your family (including grandparents) or others caring for your newborn are up-to-date with the Tdap or Td immunizations. The Tdap or Td immunization can help protect your newborn from getting ill.  Rubella immunization.  Varicella (chickenpox) immunization.  Influenza immunization. You should  receive this annual immunization if you did not receive the immunization during your pregnancy.   This information is not intended to replace advice given to you by your health care provider. Make sure you discuss any questions you have with your health care provider.   Document Released: 10/03/2007 Document Revised: 08/30/2012 Document Reviewed: 08/02/2012 Elsevier Interactive Patient Education 2016 [employer].   Etonogestrel implant (AKA "Nexplanon") What is this medicine? ETONOGESTREL (et oh noe JES trel) is a contraceptive (birth control) device. It is used to prevent pregnancy. It can be used for up to 3 years. This medicine may be used for other purposes; ask your health care provider or pharmacist if you have questions. What should I tell my health care provider before I take this medicine? They need to know if you have any of these conditions: -abnormal vaginal bleeding -blood vessel disease or blood clots -cancer of the breast, cervix, or liver -depression -diabetes -gallbladder disease -headaches -heart disease or recent heart attack -high blood pressure -high cholesterol -kidney disease -liver disease -renal disease -seizures -tobacco smoker -an unusual or allergic reaction to etonogestrel, other hormones, anesthetics or antiseptics, medicines, foods, dyes, or preservatives -pregnant or trying to get pregnant -breast-feeding How should I use this medicine? This device is inserted just under the skin on the inner side of your upper arm by a health care professional. Talk to your pediatrician regarding the use of this medicine in children. Special care may be needed. Overdosage: If you think you have taken too much of this medicine contact a poison control center or emergency room at once. NOTE: This medicine is only for you. Do not share this medicine with others. What if I miss a dose? This does not apply. What may interact with this medicine? Do not take this  medicine with any of the following medications: -amprenavir -bosentan -fosamprenavir This medicine may also interact with the following medications: -barbiturate medicines for inducing sleep or treating seizures -certain medicines for fungal infections like ketoconazole and itraconazole -griseofulvin -medicines to treat seizures like carbamazepine, felbamate, oxcarbazepine, phenytoin, topiramate -modafinil -phenylbutazone -rifampin -some medicines to treat HIV infection like atazanavir, indinavir, lopinavir, nelfinavir, tipranavir, ritonavir -St. John's wort This list may not describe all possible interactions. Give your health care provider a list of all the medicines, herbs, non-prescription drugs, or dietary supplements you use. Also tell them if you smoke, drink alcohol, or use illegal drugs. Some items may interact with your medicine. What should I watch for while using this medicine? This product does not protect you against HIV infection (AIDS) or other sexually transmitted diseases. You should be able to feel the implant by pressing your fingertips over the skin where it was inserted. Contact your doctor if you cannot feel the implant, and use a non-hormonal birth control method (such as condoms) until your doctor confirms that the implant is in place. If you feel that the implant may have broken or become bent while in your arm, contact your healthcare provider. What side effects may  I notice from receiving this medicine? Side effects that you should report to your doctor or health care professional as soon as possible: -allergic reactions like skin rash, itching or hives, swelling of the face, lips, or tongue -breast lumps -changes in emotions or moods -depressed mood -heavy or prolonged menstrual bleeding -pain, irritation, swelling, or bruising at the insertion site -scar at site of insertion -signs of infection at the insertion site such as fever, and skin redness, pain or  discharge -signs of pregnancy -signs and symptoms of a blood clot such as breathing problems; changes in vision; chest pain; severe, sudden headache; pain, swelling, warmth in the leg; trouble speaking; sudden numbness or weakness of the face, arm or leg -signs and symptoms of liver injury like dark yellow or brown urine; general ill feeling or flu-like symptoms; light-colored stools; loss of appetite; nausea; right upper belly pain; unusually weak or tired; yellowing of the eyes or skin -unusual vaginal bleeding, discharge -signs and symptoms of a stroke like changes in vision; confusion; trouble speaking or understanding; severe headaches; sudden numbness or weakness of the face, arm or leg; trouble walking; dizziness; loss of balance or coordination Side effects that usually do not require medical attention (Report these to your doctor or health care professional if they continue or are bothersome.): -acne -back pain -breast pain -changes in weight -dizziness -general ill feeling or flu-like symptoms -headache -irregular menstrual bleeding -nausea -sore throat -vaginal irritation or inflammation This list may not describe all possible side effects. Call your doctor for medical advice about side effects. You may report side effects to FDA at 1-800-FDA-1088. Where should I keep my medicine? This drug is given in a hospital or clinic and will not be stored at home. NOTE: This sheet is a summary. It may not cover all possible information. If you have questions about this medicine, talk to your doctor, pharmacist, or health care provider.    2016, Elsevier/Gold Standard. (2014-09-20 14:07:06)

## 2017-04-27 IMAGING — CR DG CHEST 2V
2 series · 2 of 2 positions shown · non-contrast
Comparison: January 29, 2008.

CLINICAL DATA: Acute left-sided chest pain.

EXAM:
CHEST  2 VIEW

[w chest pa]
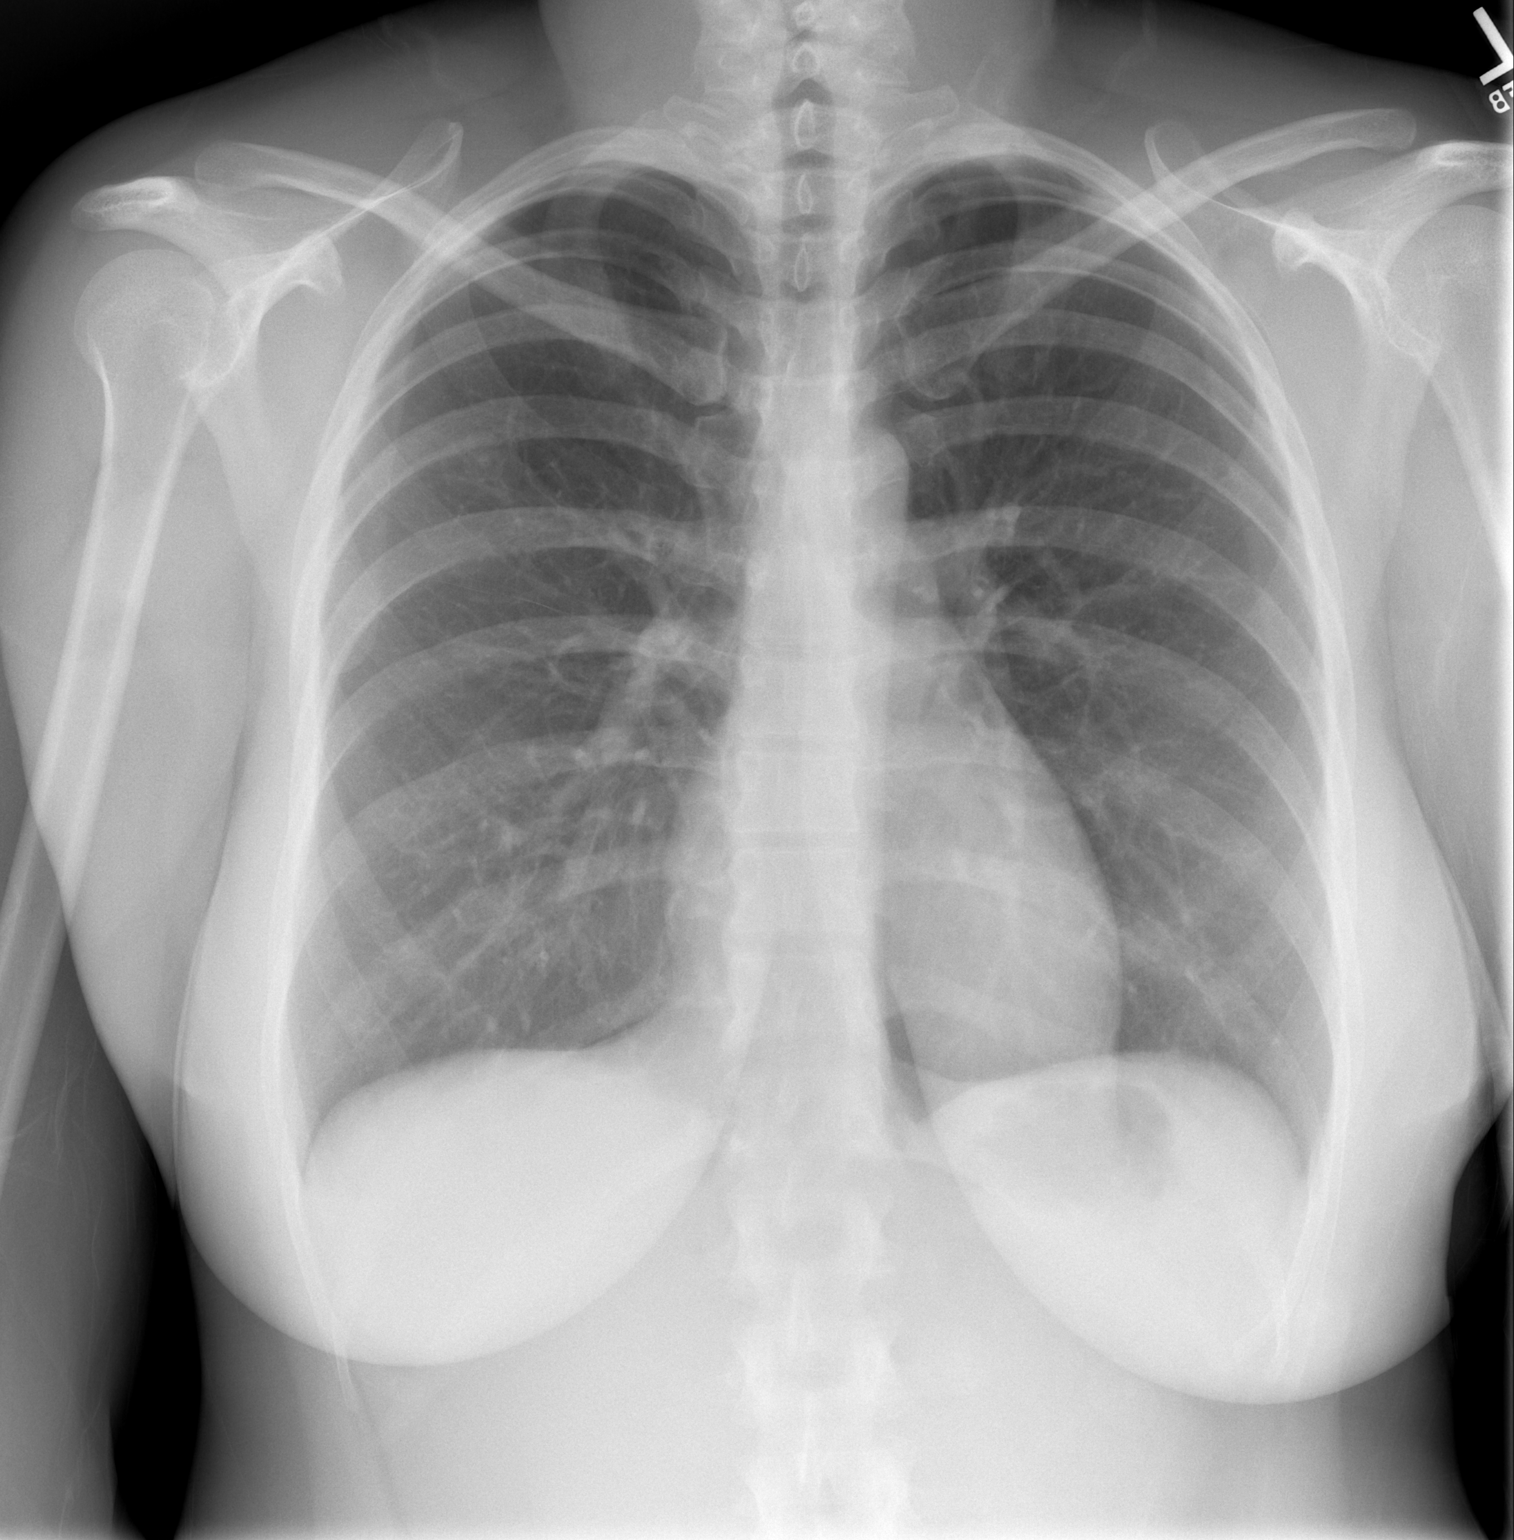

[w chest lat]
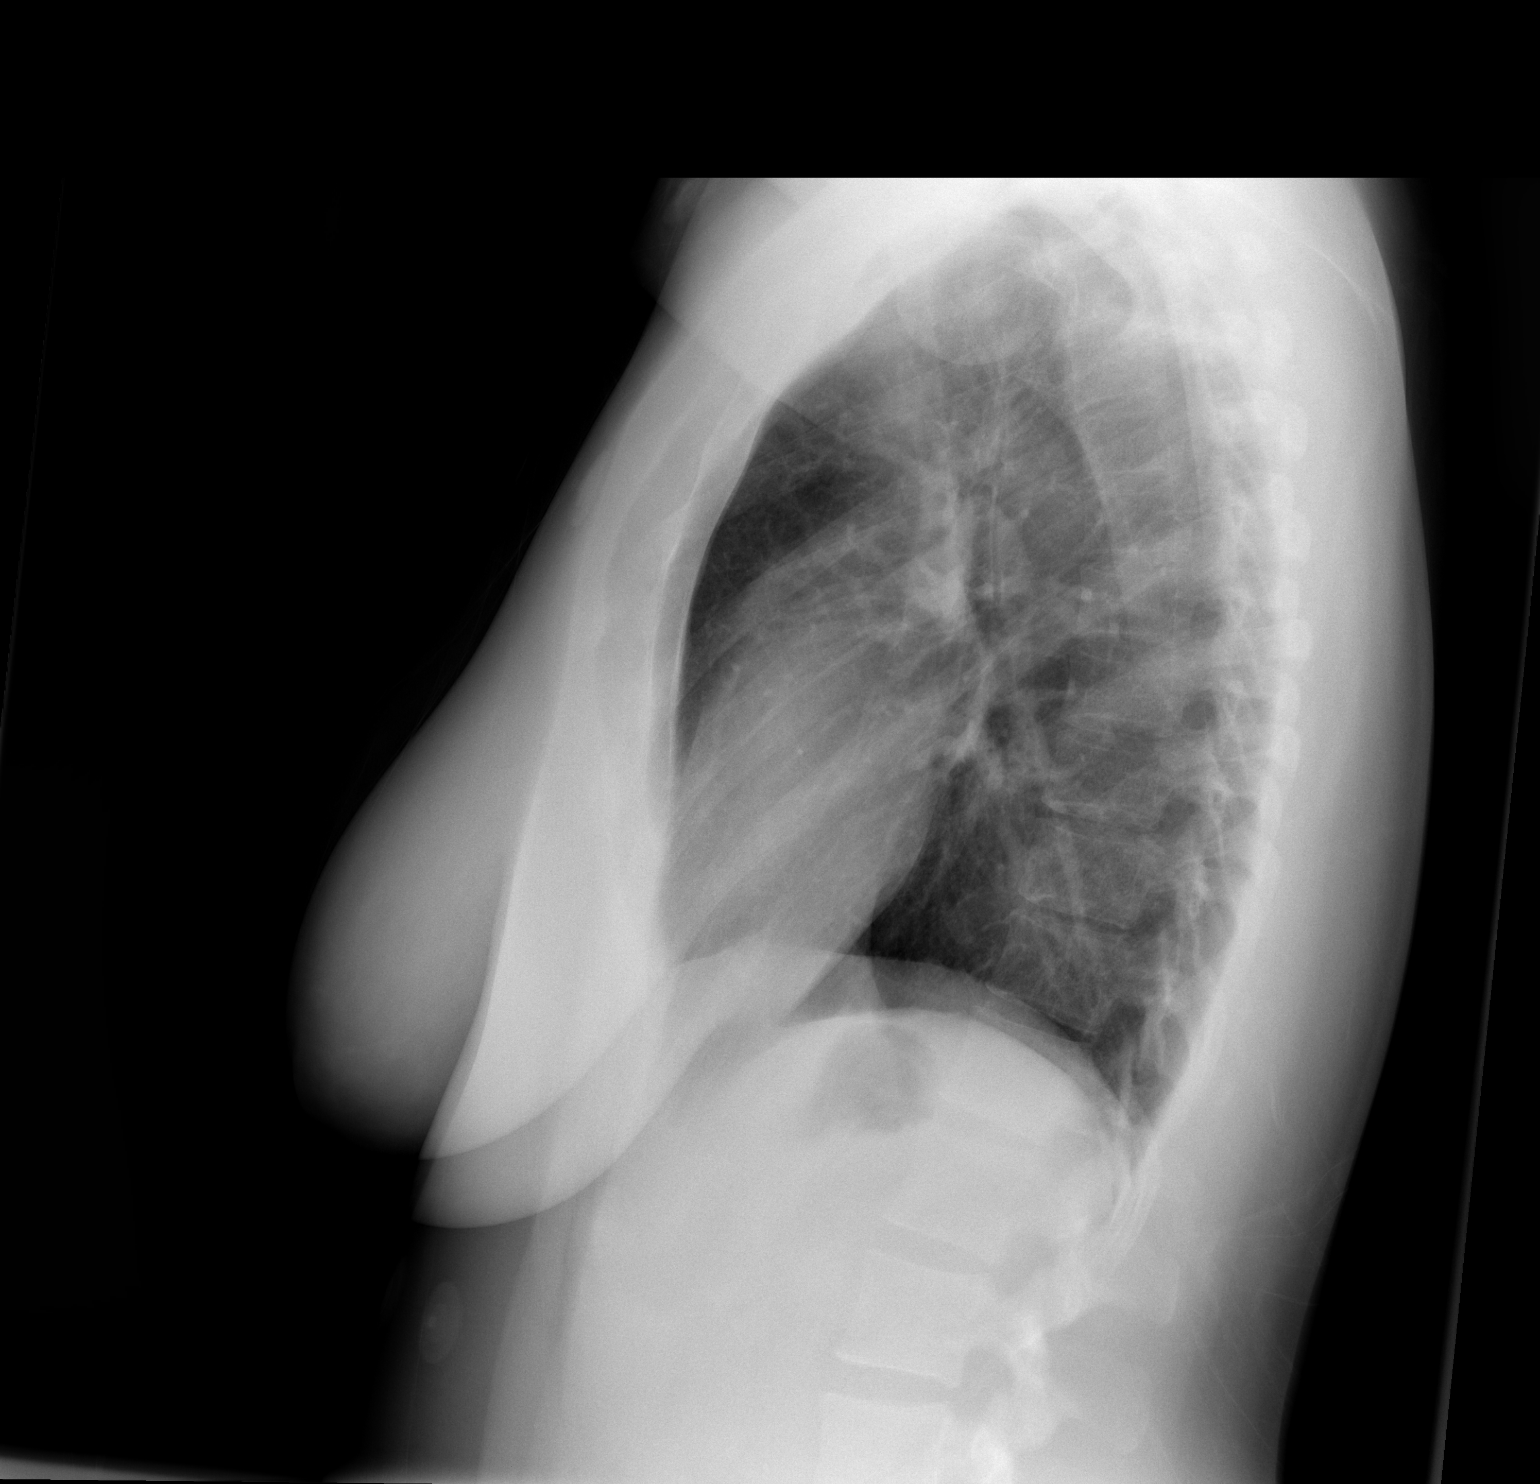

[2 of 2 positions shown; findings below may reference images not displayed]

FINDINGS: The heart size and mediastinal contours are within normal limits.
Both lungs are clear. No pneumothorax or pleural effusion is noted.
The visualized skeletal structures are unremarkable.
IMPRESSION: No active cardiopulmonary disease.
